# Patient Record
Sex: Female | Born: 1985 | Race: White | Hispanic: No | Marital: Married | Smoking: Never smoker
Health system: Southern US, Community
[De-identification: ages and names within clinical notes are randomized; demographics above are authoritative.]

## PROBLEM LIST (undated history)

## (undated) DIAGNOSIS — Z872 Personal history of diseases of the skin and subcutaneous tissue: Secondary | ICD-10-CM

## (undated) DIAGNOSIS — D649 Anemia, unspecified: Secondary | ICD-10-CM

## (undated) DIAGNOSIS — J189 Pneumonia, unspecified organism: Secondary | ICD-10-CM

## (undated) DIAGNOSIS — F419 Anxiety disorder, unspecified: Secondary | ICD-10-CM

## (undated) DIAGNOSIS — N390 Urinary tract infection, site not specified: Secondary | ICD-10-CM

## (undated) DIAGNOSIS — T8859XA Other complications of anesthesia, initial encounter: Secondary | ICD-10-CM

## (undated) DIAGNOSIS — K219 Gastro-esophageal reflux disease without esophagitis: Secondary | ICD-10-CM

## (undated) DIAGNOSIS — Z9889 Other specified postprocedural states: Secondary | ICD-10-CM

## (undated) DIAGNOSIS — R51 Headache: Secondary | ICD-10-CM

## (undated) DIAGNOSIS — J45909 Unspecified asthma, uncomplicated: Secondary | ICD-10-CM

## (undated) DIAGNOSIS — R112 Nausea with vomiting, unspecified: Secondary | ICD-10-CM

## (undated) DIAGNOSIS — R519 Headache, unspecified: Secondary | ICD-10-CM

## (undated) DIAGNOSIS — E669 Obesity, unspecified: Secondary | ICD-10-CM

## (undated) DIAGNOSIS — T4145XA Adverse effect of unspecified anesthetic, initial encounter: Secondary | ICD-10-CM

## (undated) DIAGNOSIS — K8020 Calculus of gallbladder without cholecystitis without obstruction: Secondary | ICD-10-CM

## (undated) DIAGNOSIS — R87629 Unspecified abnormal cytological findings in specimens from vagina: Secondary | ICD-10-CM

## (undated) DIAGNOSIS — K224 Dyskinesia of esophagus: Secondary | ICD-10-CM

## (undated) HISTORY — DX: Pneumonia, unspecified organism: J18.9

## (undated) HISTORY — DX: Unspecified abnormal cytological findings in specimens from vagina: R87.629

## (undated) HISTORY — DX: Urinary tract infection, site not specified: N39.0

## (undated) HISTORY — PX: TONSILLECTOMY: SUR1361

## (undated) HISTORY — DX: Personal history of diseases of the skin and subcutaneous tissue: Z87.2

## (undated) HISTORY — DX: Dyskinesia of esophagus: K22.4

## (undated) HISTORY — DX: Anemia, unspecified: D64.9

---

## 2009-05-19 ENCOUNTER — Inpatient Hospital Stay (HOSPITAL_COMMUNITY): Admission: RE | Admit: 2009-05-19 | Discharge: 2009-05-22 | Payer: Self-pay | Admitting: Obstetrics and Gynecology

## 2010-09-05 LAB — CBC
HCT: 30.6 % — ABNORMAL LOW (ref 36.0–46.0)
Hemoglobin: 10.4 g/dL — ABNORMAL LOW (ref 12.0–15.0)
MCHC: 34.2 g/dL (ref 30.0–36.0)
RDW: 14 % (ref 11.5–15.5)

## 2010-09-06 LAB — RPR: RPR Ser Ql: NONREACTIVE

## 2010-09-06 LAB — CBC
MCHC: 34.4 g/dL (ref 30.0–36.0)
RDW: 13.8 % (ref 11.5–15.5)

## 2013-06-04 LAB — OB RESULTS CONSOLE RPR: RPR: NONREACTIVE

## 2013-06-04 LAB — OB RESULTS CONSOLE HIV ANTIBODY (ROUTINE TESTING): HIV: NONREACTIVE

## 2013-06-04 LAB — OB RESULTS CONSOLE HEPATITIS B SURFACE ANTIGEN: HEP B S AG: NEGATIVE

## 2013-06-04 LAB — OB RESULTS CONSOLE ANTIBODY SCREEN: ANTIBODY SCREEN: NEGATIVE

## 2013-06-04 LAB — OB RESULTS CONSOLE ABO/RH: RH TYPE: POSITIVE

## 2013-06-04 LAB — OB RESULTS CONSOLE RUBELLA ANTIBODY, IGM: RUBELLA: IMMUNE

## 2013-08-22 ENCOUNTER — Other Ambulatory Visit (HOSPITAL_COMMUNITY): Payer: Self-pay | Admitting: Obstetrics and Gynecology

## 2013-08-22 DIAGNOSIS — R1011 Right upper quadrant pain: Secondary | ICD-10-CM

## 2013-08-25 ENCOUNTER — Ambulatory Visit (HOSPITAL_COMMUNITY)
Admission: RE | Admit: 2013-08-25 | Discharge: 2013-08-25 | Disposition: A | Discharge status: Home / Self Care | Payer: 59 | Source: Ambulatory Visit | Attending: Obstetrics and Gynecology | Admitting: Obstetrics and Gynecology

## 2013-08-25 DIAGNOSIS — R1011 Right upper quadrant pain: Secondary | ICD-10-CM

## 2013-08-26 ENCOUNTER — Ambulatory Visit (HOSPITAL_COMMUNITY): Payer: 59

## 2013-10-27 ENCOUNTER — Other Ambulatory Visit: Payer: Self-pay | Admitting: Obstetrics and Gynecology

## 2013-12-11 ENCOUNTER — Encounter (HOSPITAL_COMMUNITY): Payer: Self-pay | Admitting: Pharmacist

## 2013-12-15 ENCOUNTER — Encounter (HOSPITAL_COMMUNITY): Payer: Self-pay

## 2013-12-16 ENCOUNTER — Encounter (HOSPITAL_COMMUNITY)
Admission: RE | Admit: 2013-12-16 | Discharge: 2013-12-16 | Disposition: A | Discharge status: Home / Self Care | Payer: 59 | Source: Ambulatory Visit | Attending: Obstetrics and Gynecology | Admitting: Obstetrics and Gynecology

## 2013-12-16 ENCOUNTER — Encounter (HOSPITAL_COMMUNITY): Payer: Self-pay

## 2013-12-16 HISTORY — DX: Unspecified asthma, uncomplicated: J45.909

## 2013-12-16 HISTORY — DX: Adverse effect of unspecified anesthetic, initial encounter: T41.45XA

## 2013-12-16 HISTORY — DX: Nausea with vomiting, unspecified: R11.2

## 2013-12-16 HISTORY — DX: Other specified postprocedural states: Z98.890

## 2013-12-16 HISTORY — DX: Other complications of anesthesia, initial encounter: T88.59XA

## 2013-12-16 LAB — URINALYSIS, ROUTINE W REFLEX MICROSCOPIC
Bilirubin Urine: NEGATIVE
Glucose, UA: NEGATIVE mg/dL
Hgb urine dipstick: NEGATIVE
KETONES UR: NEGATIVE mg/dL
NITRITE: NEGATIVE
PH: 7 (ref 5.0–8.0)
PROTEIN: NEGATIVE mg/dL
Specific Gravity, Urine: 1.015 (ref 1.005–1.030)
Urobilinogen, UA: 0.2 mg/dL (ref 0.0–1.0)

## 2013-12-16 LAB — ABO/RH: ABO/RH(D): O POS

## 2013-12-16 LAB — COMPREHENSIVE METABOLIC PANEL
ALBUMIN: 2.6 g/dL — AB (ref 3.5–5.2)
ALT: 23 U/L (ref 0–35)
ANION GAP: 13 (ref 5–15)
AST: 29 U/L (ref 0–37)
Alkaline Phosphatase: 188 U/L — ABNORMAL HIGH (ref 39–117)
BILIRUBIN TOTAL: 0.3 mg/dL (ref 0.3–1.2)
BUN: 4 mg/dL — AB (ref 6–23)
CALCIUM: 9.1 mg/dL (ref 8.4–10.5)
CHLORIDE: 101 meq/L (ref 96–112)
CO2: 23 mEq/L (ref 19–32)
CREATININE: 0.53 mg/dL (ref 0.50–1.10)
GFR calc Af Amer: >90 mL/min (ref >90)
GFR calc non Af Amer: >90 mL/min (ref >90)
Glucose, Bld: 130 mg/dL — ABNORMAL HIGH (ref 70–99)
Potassium: 3.4 mEq/L — ABNORMAL LOW (ref 3.7–5.3)
Sodium: 137 mEq/L (ref 137–147)
Total Protein: 5.6 g/dL — ABNORMAL LOW (ref 6.0–8.3)

## 2013-12-16 LAB — CBC
HCT: 34.7 % — ABNORMAL LOW (ref 36.0–46.0)
Hemoglobin: 11.7 g/dL — ABNORMAL LOW (ref 12.0–15.0)
MCH: 30.2 pg (ref 26.0–34.0)
MCHC: 33.7 g/dL (ref 30.0–36.0)
MCV: 89.7 fL (ref 78.0–100.0)
Platelets: 196 10*3/uL (ref 150–400)
RBC: 3.87 MIL/uL (ref 3.87–5.11)
RDW: 14.3 % (ref 11.5–15.5)
WBC: 9.4 10*3/uL (ref 4.0–10.5)

## 2013-12-16 LAB — URINE MICROSCOPIC-ADD ON

## 2013-12-16 LAB — TYPE AND SCREEN
ABO/RH(D): O POS
Antibody Screen: NEGATIVE

## 2013-12-16 MED ORDER — DEXTROSE 5 % IV SOLN
3.0000 g | INTRAVENOUS | Status: AC
  Administered 2013-12-17: 3 g via INTRAVENOUS
  Filled 2013-12-16: qty 3000

## 2013-12-16 NOTE — Patient Instructions (Signed)
[address] [name]  12/16/2013   Your procedure is scheduled on:  12/17/13  Enter through the Main Entrance of [facility] at [place] up the phone at the desk and dial [phone].   Call this number if you have problems the morning of surgery: [phone]   Remember:   Do not eat food:After Midnight.  Do not drink clear liquids: After Midnight.  Take these medicines the morning of surgery with A SIP OF WATER: Zantac   Do not wear jewelry, make-up or nail polish.  Do not wear lotions, powders, or perfumes. You may wear deodorant.  Do not shave 48 hours prior to surgery.  Do not bring valuables to the hospital.  [facility] is not   responsible for any belongings or valuables brought to the hospital.  Contacts, dentures or bridgework may not be worn into surgery.  Leave suitcase in the car. After surgery it may be brought to your room.  For patients admitted to the hospital, checkout time is 11:00 AM the day of              discharge.   Patients discharged the day of surgery will not be allowed to drive             home.  Name and phone number of your driver: NA  Special Instructions:      Please read over the following fact sheets that you were given:   Surgical Site Infection Prevention

## 2013-12-16 NOTE — H&P (Signed)
NAME:  [name], [name]              ACCOUNT NO.:  [id number]  MEDICAL RECORD NO.:  [id number]  LOCATION:  [place]                           FACILITY:  [facility]  PHYSICIAN:  Lucille Passy. Ulanda Edison, M.D. DATE OF BIRTH:  1985/12/05  DATE OF ADMISSION:  12/16/2013 DATE OF DISCHARGE:                             HISTORY & PHYSICAL   HISTORY OF PRESENT ILLNESS:  This is a 28 year old white female, para 1- 0-0-1, gravida 2, [facility] December 23, 2013, admitted for repeat C-section after declining vaginal birth after cesarean.  Blood group and type O positive, negative antibody, RPR negative, hepatitis B surface antigen negative, HIV negative, GC and Chlamydia negative.  Rubella immune.  Pap smear normal.  Cystic fibrosis declined first trimester and second trimester screening declined.  One hour Glucola 151, 3 hour GTT 82, 179, 147, and 167.  Followup was 83, 205, 135, and 94.  The patient was advised to watch her carbohydrates.  She did not have gestational diabetes.  Repeat HIV and RPR were negative.  Group B strep was negative.  The patient began her prenatal course early in pregnancy. She had an ultrasound on July 21, 2013.  [facility] December 23, 2013.  She complained of right upper quadrant pain at about 22 weeks.  She underwent a gallbladder ultrasound that showed no gallstones.  She continued to have intermittent right upper quadrant pain.  Her initial weight during the pregnancy was 268, her final weight was 290.  Blood pressure remained normal throughout the pregnancy continued to report a normal fetal movement.  She declined vaginal birth after cesarean and is admitted for repeat C-section.  PAST MEDICAL HISTORY:  Reveals no major medical illnesses.  She did have a fracture of the foot in January, 2014.  She has had constipation, heartburn, migraines, and some PVCs.  PAST SURGICAL HISTORY:  She had a T and A at age 57, C-section in 2010.  ALLERGIES:  None known, but she is sensitive to Percocet that  cause vomiting.  SOCIAL HISTORY:  Never smoker.  No alcohol.  Denies illicit drugs.  Four years of college.  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 She works for hospice and administration.  Her husband is a Engineer, structural with [employer].  FAMILY HISTORY:  Father with diabetes, high blood pressure.  Mother with high blood pressure, irritable bowel.  Maternal grandmother has heart failure, COPD, disorder of the thyroid gland, stroke, and dementia. Maternal grandfather, heart attack, stroke, coronary artery bypass graft, malignant melanoma of the skin, heart disease.  Paternal grandmother with Alzheimer's.  Maternal aunt with emphysema, TIAs, COPD, multiple sclerosis, stroke, and cancer of the cervix.  Paternal aunt had tumor of the pancreas that was malignant.  PHYSICAL EXAMINATION:  GENERAL:  On admission, this is an obese white female, in no distress. VITAL SIGNS:  She weighs 290 pounds.  Her blood pressure is 118/78, pulse is 70. HEART:  Normal size and sounds.  No murmurs. LUNGS:  Clear to auscultation. ABDOMEN:  Fundal height is 40 cm.  Fetal heart tones are normal.  The prior incision is somewhat in the crease, but I plan to use the same incision.  Cervix is not examined today  by Leopold's maneuver, it feels like its vertex.  ADMITTING IMPRESSION:  Intrauterine pregnancy 39 weeks 1 day, prior C- section, declines vaginal birth after cesarean.  The patient is admitted for repeat C-section.     Lucille Passy. Ulanda Edison, M.D.     TFH/MEDQ  D:  12/16/2013  T:  12/16/2013  Job:  [id number]

## 2013-12-17 ENCOUNTER — Encounter (HOSPITAL_COMMUNITY): Payer: Self-pay | Admitting: Anesthesiology

## 2013-12-17 ENCOUNTER — Inpatient Hospital Stay (HOSPITAL_COMMUNITY)
Admission: RE | Admit: 2013-12-17 | Discharge: 2013-12-19 | DRG: 765 | Disposition: A | Discharge status: Home / Self Care | Payer: 59 | Source: Ambulatory Visit | Attending: Obstetrics and Gynecology | Admitting: Obstetrics and Gynecology

## 2013-12-17 ENCOUNTER — Inpatient Hospital Stay (HOSPITAL_COMMUNITY): Payer: 59 | Admitting: Anesthesiology

## 2013-12-17 ENCOUNTER — Encounter (HOSPITAL_COMMUNITY): Payer: 59 | Admitting: Anesthesiology

## 2013-12-17 ENCOUNTER — Encounter (HOSPITAL_COMMUNITY)
Admission: RE | Disposition: A | Discharge status: Home / Self Care | Payer: Self-pay | Source: Ambulatory Visit | Attending: Obstetrics and Gynecology

## 2013-12-17 DIAGNOSIS — Z98891 History of uterine scar from previous surgery: Secondary | ICD-10-CM

## 2013-12-17 DIAGNOSIS — E669 Obesity, unspecified: Secondary | ICD-10-CM | POA: Diagnosis present

## 2013-12-17 DIAGNOSIS — O34219 Maternal care for unspecified type scar from previous cesarean delivery: Principal | ICD-10-CM | POA: Diagnosis present

## 2013-12-17 DIAGNOSIS — K219 Gastro-esophageal reflux disease without esophagitis: Secondary | ICD-10-CM | POA: Diagnosis present

## 2013-12-17 DIAGNOSIS — Z6841 Body Mass Index (BMI) 40.0 and over, adult: Secondary | ICD-10-CM

## 2013-12-17 DIAGNOSIS — O99214 Obesity complicating childbirth: Secondary | ICD-10-CM

## 2013-12-17 SURGERY — Surgical Case
Anesthesia: Spinal | Site: Abdomen

## 2013-12-17 MED ORDER — PHENYLEPHRINE HCL 10 MG/ML IJ SOLN
INTRAMUSCULAR | Status: AC
  Filled 2013-12-17: qty 1

## 2013-12-17 MED ORDER — LACTATED RINGERS IV SOLN
INTRAVENOUS | Status: DC | PRN
  Administered 2013-12-17: 12:00:00 via INTRAVENOUS

## 2013-12-17 MED ORDER — SIMETHICONE 80 MG PO CHEW
80.0000 mg | CHEWABLE_TABLET | ORAL | Status: DC
  Administered 2013-12-18 – 2013-12-19 (×2): 80 mg via ORAL
  Filled 2013-12-17 (×2): qty 1

## 2013-12-17 MED ORDER — OXYTOCIN 40 UNITS IN LACTATED RINGERS INFUSION - SIMPLE MED: 62.5000 mL/h | INTRAVENOUS | Status: AC

## 2013-12-17 MED ORDER — MENTHOL 3 MG MT LOZG: 1.0000 | LOZENGE | OROMUCOSAL | Status: DC | PRN

## 2013-12-17 MED ORDER — ONDANSETRON HCL 4 MG/2ML IJ SOLN
INTRAMUSCULAR | Status: AC
  Filled 2013-12-17: qty 2

## 2013-12-17 MED ORDER — PHENYLEPHRINE 8 MG IN D5W 100 ML (0.08MG/ML) PREMIX OPTIME
INJECTION | INTRAVENOUS | Status: DC | PRN
  Administered 2013-12-17: 60 ug/min via INTRAVENOUS

## 2013-12-17 MED ORDER — DIBUCAINE 1 % RE OINT: 1.0000 "application " | TOPICAL_OINTMENT | RECTAL | Status: DC | PRN

## 2013-12-17 MED ORDER — SCOPOLAMINE 1 MG/3DAYS TD PT72
1.0000 | MEDICATED_PATCH | Freq: Once | TRANSDERMAL | Status: DC
  Administered 2013-12-17: 1.5 mg via TRANSDERMAL

## 2013-12-17 MED ORDER — BUPIVACAINE IN DEXTROSE 0.75-8.25 % IT SOLN
INTRATHECAL | Status: DC | PRN
  Administered 2013-12-17: 2 mL via INTRATHECAL

## 2013-12-17 MED ORDER — CALCIUM CARBONATE ANTACID 500 MG PO CHEW
1.0000 | CHEWABLE_TABLET | Freq: Two times a day (BID) | ORAL | Status: DC
  Administered 2013-12-18: 200 mg via ORAL
  Filled 2013-12-17 (×2): qty 1

## 2013-12-17 MED ORDER — NALOXONE HCL 0.4 MG/ML IJ SOLN: 0.2500 mg | Freq: Once | INTRAMUSCULAR | Status: DC

## 2013-12-17 MED ORDER — ONDANSETRON HCL 4 MG/2ML IJ SOLN: 4.0000 mg | Freq: Three times a day (TID) | INTRAMUSCULAR | Status: DC | PRN

## 2013-12-17 MED ORDER — ONDANSETRON HCL 4 MG PO TABS: 4.0000 mg | ORAL_TABLET | ORAL | Status: DC | PRN

## 2013-12-17 MED ORDER — NALOXONE HCL 1 MG/ML IJ SOLN
1.0000 ug/kg/h | INTRAMUSCULAR | Status: DC | PRN
  Filled 2013-12-17: qty 2

## 2013-12-17 MED ORDER — SODIUM CHLORIDE 0.9 % IJ SOLN: 3.0000 mL | INTRAMUSCULAR | Status: DC | PRN

## 2013-12-17 MED ORDER — PNEUMOCOCCAL VAC POLYVALENT 25 MCG/0.5ML IJ INJ
0.5000 mL | INJECTION | INTRAMUSCULAR | Status: DC
  Filled 2013-12-17: qty 0.5

## 2013-12-17 MED ORDER — ONDANSETRON HCL 4 MG/2ML IJ SOLN: 4.0000 mg | INTRAMUSCULAR | Status: DC | PRN

## 2013-12-17 MED ORDER — SIMETHICONE 80 MG PO CHEW
80.0000 mg | CHEWABLE_TABLET | Freq: Three times a day (TID) | ORAL | Status: DC
  Administered 2013-12-17 – 2013-12-19 (×5): 80 mg via ORAL
  Filled 2013-12-17 (×5): qty 1

## 2013-12-17 MED ORDER — FENTANYL CITRATE 0.05 MG/ML IJ SOLN
25.0000 ug | INTRAMUSCULAR | Status: DC | PRN
  Administered 2013-12-17 (×2): 25 ug via INTRAVENOUS

## 2013-12-17 MED ORDER — LANOLIN HYDROUS EX OINT: 1.0000 "application " | TOPICAL_OINTMENT | CUTANEOUS | Status: DC | PRN

## 2013-12-17 MED ORDER — NALOXONE HCL 0.4 MG/ML IJ SOLN: 0.4000 mg | INTRAMUSCULAR | Status: DC | PRN

## 2013-12-17 MED ORDER — MORPHINE SULFATE 0.5 MG/ML IJ SOLN
INTRAMUSCULAR | Status: AC
  Filled 2013-12-17: qty 10

## 2013-12-17 MED ORDER — MORPHINE SULFATE (PF) 0.5 MG/ML IJ SOLN
INTRAMUSCULAR | Status: DC | PRN
  Administered 2013-12-17: .15 mg via INTRATHECAL

## 2013-12-17 MED ORDER — LACTATED RINGERS IV SOLN
Freq: Once | INTRAVENOUS | Status: AC
  Administered 2013-12-17: 09:00:00 via INTRAVENOUS

## 2013-12-17 MED ORDER — METOCLOPRAMIDE HCL 5 MG/ML IJ SOLN: 10.0000 mg | Freq: Three times a day (TID) | INTRAMUSCULAR | Status: DC | PRN

## 2013-12-17 MED ORDER — DIPHENHYDRAMINE HCL 25 MG PO CAPS
25.0000 mg | ORAL_CAPSULE | ORAL | Status: DC | PRN
  Administered 2013-12-17 – 2013-12-18 (×2): 25 mg via ORAL
  Filled 2013-12-17: qty 1

## 2013-12-17 MED ORDER — TETANUS-DIPHTH-ACELL PERTUSSIS 5-2.5-18.5 LF-MCG/0.5 IM SUSP: 0.5000 mL | Freq: Once | INTRAMUSCULAR | Status: DC

## 2013-12-17 MED ORDER — OXYTOCIN 10 UNIT/ML IJ SOLN
40.0000 [IU] | INTRAVENOUS | Status: DC | PRN
  Administered 2013-12-17: 40 [IU] via INTRAVENOUS

## 2013-12-17 MED ORDER — OXYTOCIN 10 UNIT/ML IJ SOLN
INTRAMUSCULAR | Status: AC
  Filled 2013-12-17: qty 4

## 2013-12-17 MED ORDER — PHENYLEPHRINE 40 MCG/ML (10ML) SYRINGE FOR IV PUSH (FOR BLOOD PRESSURE SUPPORT)
PREFILLED_SYRINGE | INTRAVENOUS | Status: AC
  Filled 2013-12-17: qty 5

## 2013-12-17 MED ORDER — MEPERIDINE HCL 25 MG/ML IJ SOLN: 6.2500 mg | INTRAMUSCULAR | Status: DC | PRN

## 2013-12-17 MED ORDER — LACTATED RINGERS IV SOLN
INTRAVENOUS | Status: AC
  Administered 2013-12-17 – 2013-12-18 (×2): via INTRAVENOUS

## 2013-12-17 MED ORDER — NALBUPHINE HCL 10 MG/ML IJ SOLN
2.5000 mg | Freq: Once | INTRAMUSCULAR | Status: AC
  Administered 2013-12-17: 2.5 mg via SUBCUTANEOUS
  Filled 2013-12-17: qty 1

## 2013-12-17 MED ORDER — DIPHENHYDRAMINE HCL 25 MG PO CAPS
25.0000 mg | ORAL_CAPSULE | Freq: Four times a day (QID) | ORAL | Status: DC | PRN
  Filled 2013-12-17 (×2): qty 1

## 2013-12-17 MED ORDER — ONDANSETRON HCL 4 MG/2ML IJ SOLN
INTRAMUSCULAR | Status: DC | PRN
  Administered 2013-12-17: 4 mg via INTRAVENOUS

## 2013-12-17 MED ORDER — SENNOSIDES-DOCUSATE SODIUM 8.6-50 MG PO TABS
2.0000 | ORAL_TABLET | ORAL | Status: DC
  Administered 2013-12-18 – 2013-12-19 (×2): 2 via ORAL
  Filled 2013-12-17 (×2): qty 2

## 2013-12-17 MED ORDER — SCOPOLAMINE 1 MG/3DAYS TD PT72: 1.0000 | MEDICATED_PATCH | Freq: Once | TRANSDERMAL | Status: DC

## 2013-12-17 MED ORDER — MORPHINE SULFATE 0.5 MG/ML IJ SOLN
INTRAMUSCULAR | Status: AC
Start: 2013-12-17 — End: 2013-12-17
  Filled 2013-12-17: qty 10

## 2013-12-17 MED ORDER — SCOPOLAMINE 1 MG/3DAYS TD PT72
MEDICATED_PATCH | TRANSDERMAL | Status: DC
Start: 2013-12-17 — End: 2013-12-19
  Administered 2013-12-17: 1.5 mg via TRANSDERMAL
  Filled 2013-12-17: qty 1

## 2013-12-17 MED ORDER — FAMOTIDINE 20 MG PO TABS
20.0000 mg | ORAL_TABLET | Freq: Two times a day (BID) | ORAL | Status: AC
  Administered 2013-12-18: 20 mg via ORAL
  Filled 2013-12-17 (×2): qty 1

## 2013-12-17 MED ORDER — CEFAZOLIN SODIUM-DEXTROSE 2-3 GM-% IV SOLR
2.0000 g | Freq: Three times a day (TID) | INTRAVENOUS | Status: AC
  Administered 2013-12-17 – 2013-12-18 (×2): 2 g via INTRAVENOUS
  Filled 2013-12-17 (×2): qty 50

## 2013-12-17 MED ORDER — FENTANYL CITRATE 0.05 MG/ML IJ SOLN
INTRAMUSCULAR | Status: AC
  Filled 2013-12-17: qty 2

## 2013-12-17 MED ORDER — ZOLPIDEM TARTRATE 5 MG PO TABS: 5.0000 mg | ORAL_TABLET | Freq: Every evening | ORAL | Status: DC | PRN

## 2013-12-17 MED ORDER — DIPHENHYDRAMINE HCL 50 MG/ML IJ SOLN
12.5000 mg | INTRAMUSCULAR | Status: DC | PRN
  Administered 2013-12-17: 12.5 mg via INTRAVENOUS

## 2013-12-17 MED ORDER — HYDROCODONE-ACETAMINOPHEN 5-325 MG PO TABS
1.0000 | ORAL_TABLET | ORAL | Status: DC | PRN
  Administered 2013-12-18 (×5): 1 via ORAL
  Filled 2013-12-17 (×4): qty 1
  Filled 2013-12-17: qty 2

## 2013-12-17 MED ORDER — PRENATAL MULTIVITAMIN CH
1.0000 | ORAL_TABLET | Freq: Every day | ORAL | Status: DC
  Administered 2013-12-18 – 2013-12-19 (×2): 1 via ORAL
  Filled 2013-12-17 (×3): qty 1

## 2013-12-17 MED ORDER — PHENYLEPHRINE 8 MG IN D5W 100 ML (0.08MG/ML) PREMIX OPTIME
INJECTION | INTRAVENOUS | Status: AC
  Filled 2013-12-17: qty 100

## 2013-12-17 MED ORDER — MEASLES, MUMPS & RUBELLA VAC ~~LOC~~ INJ
0.5000 mL | INJECTION | Freq: Once | SUBCUTANEOUS | Status: DC
  Filled 2013-12-17: qty 0.5

## 2013-12-17 MED ORDER — DIPHENHYDRAMINE HCL 50 MG/ML IJ SOLN
INTRAMUSCULAR | Status: AC
  Filled 2013-12-17: qty 1

## 2013-12-17 MED ORDER — KETOROLAC TROMETHAMINE 30 MG/ML IJ SOLN
30.0000 mg | Freq: Four times a day (QID) | INTRAMUSCULAR | Status: AC | PRN
  Administered 2013-12-17: 30 mg via INTRAVENOUS

## 2013-12-17 MED ORDER — SIMETHICONE 80 MG PO CHEW: 80.0000 mg | CHEWABLE_TABLET | ORAL | Status: DC | PRN

## 2013-12-17 MED ORDER — KETOROLAC TROMETHAMINE 30 MG/ML IJ SOLN: 30.0000 mg | Freq: Four times a day (QID) | INTRAMUSCULAR | Status: AC | PRN

## 2013-12-17 MED ORDER — KETOROLAC TROMETHAMINE 30 MG/ML IJ SOLN
INTRAMUSCULAR | Status: AC
Start: 2013-12-17 — End: 2013-12-18
  Filled 2013-12-17: qty 1

## 2013-12-17 MED ORDER — IBUPROFEN 600 MG PO TABS
600.0000 mg | ORAL_TABLET | Freq: Four times a day (QID) | ORAL | Status: DC
  Administered 2013-12-17 – 2013-12-19 (×7): 600 mg via ORAL
  Filled 2013-12-17 (×7): qty 1

## 2013-12-17 MED ORDER — FENTANYL CITRATE 0.05 MG/ML IJ SOLN
INTRAMUSCULAR | Status: DC | PRN
  Administered 2013-12-17: 25 ug via INTRATHECAL

## 2013-12-17 MED ORDER — DIPHENHYDRAMINE HCL 50 MG/ML IJ SOLN: 25.0000 mg | INTRAMUSCULAR | Status: DC | PRN

## 2013-12-17 MED ORDER — WITCH HAZEL-GLYCERIN EX PADS: 1.0000 "application " | MEDICATED_PAD | CUTANEOUS | Status: DC | PRN

## 2013-12-17 MED ORDER — OXYCODONE-ACETAMINOPHEN 5-325 MG PO TABS: 1.0000 | ORAL_TABLET | ORAL | Status: DC | PRN

## 2013-12-17 MED ORDER — FENTANYL CITRATE 0.05 MG/ML IJ SOLN
INTRAMUSCULAR | Status: AC
Start: 2013-12-17 — End: 2013-12-17
  Filled 2013-12-17: qty 2

## 2013-12-17 MED ORDER — LACTATED RINGERS IV SOLN
INTRAVENOUS | Status: DC
  Administered 2013-12-17 (×3): via INTRAVENOUS

## 2013-12-17 SURGICAL SUPPLY — 31 items
CLAMP CORD UMBIL (MISCELLANEOUS) IMPLANT
CLOTH BEACON ORANGE TIMEOUT ST (SAFETY) ×3 IMPLANT
CONTAINER PREFILL 10% NBF 15ML (MISCELLANEOUS) IMPLANT
DRAPE LG THREE QUARTER DISP (DRAPES) ×3 IMPLANT
DRSG OPSITE POSTOP 4X10 (GAUZE/BANDAGES/DRESSINGS) ×3 IMPLANT
DRSG VASELINE 3X18 (GAUZE/BANDAGES/DRESSINGS) ×3 IMPLANT
DURAPREP 26ML APPLICATOR (WOUND CARE) ×3 IMPLANT
ELECT REM PT RETURN 9FT ADLT (ELECTROSURGICAL) ×3
ELECTRODE REM PT RTRN 9FT ADLT (ELECTROSURGICAL) ×1 IMPLANT
EXTRACTOR VACUUM KIWI (MISCELLANEOUS) IMPLANT
EXTRACTOR VACUUM M CUP 4 TUBE (SUCTIONS) IMPLANT
EXTRACTOR VACUUM M CUP 4' TUBE (SUCTIONS)
GLOVE BIO SURGEON STRL SZ7.5 (GLOVE) ×3 IMPLANT
GOWN STRL REUS W/TWL LRG LVL3 (GOWN DISPOSABLE) ×9 IMPLANT
KIT ABG SYR 3ML LUER SLIP (SYRINGE) IMPLANT
NEEDLE HYPO 25X5/8 SAFETYGLIDE (NEEDLE) IMPLANT
NS IRRIG 1000ML POUR BTL (IV SOLUTION) ×3 IMPLANT
PACK C SECTION WH (CUSTOM PROCEDURE TRAY) ×3 IMPLANT
PAD OB MATERNITY 4.3X12.25 (PERSONAL CARE ITEMS) ×3 IMPLANT
RTRCTR C-SECT PINK 25CM LRG (MISCELLANEOUS) ×3 IMPLANT
STAPLER VISISTAT 35W (STAPLE) ×3 IMPLANT
SUT PLAIN 0 NONE (SUTURE) IMPLANT
SUT VIC AB 0 CT1 36 (SUTURE) ×21 IMPLANT
SUT VIC AB 3-0 CTX 36 (SUTURE) ×3 IMPLANT
SUT VIC AB 3-0 SH 27 (SUTURE)
SUT VIC AB 3-0 SH 27X BRD (SUTURE) IMPLANT
SUT VIC AB 4-0 KS 27 (SUTURE) IMPLANT
SUT VICRYL 0 TIES 12 18 (SUTURE) IMPLANT
TOWEL OR 17X24 6PK STRL BLUE (TOWEL DISPOSABLE) ×3 IMPLANT
TRAY FOLEY CATH 14FR (SET/KITS/TRAYS/PACK) ×3 IMPLANT
WATER STERILE IRR 1000ML POUR (IV SOLUTION) IMPLANT

## 2013-12-17 NOTE — Anesthesia Procedure Notes (Signed)
Spinal  Patient location during procedure: OR Start time: 12/17/2013 11:41 AM Staffing Anesthesiologist: Ivi Griffith A. Performed by: anesthesiologist  Preanesthetic Checklist Completed: patient identified, site marked, surgical consent, pre-op evaluation, timeout performed, IV checked, risks and benefits discussed and monitors and equipment checked Spinal Block Patient position: sitting Prep: site prepped and draped and DuraPrep Patient monitoring: heart rate, cardiac monitor, continuous pulse ox and blood pressure Approach: midline Location: L3-4 Injection technique: single-shot Needle Needle type: Sprotte  Needle gauge: 24 G Needle length: 9 cm Needle insertion depth: 6 cm Assessment Sensory level: T4 Additional Notes Patient tolerated procedure well. Adequate sensory level.

## 2013-12-17 NOTE — Op Note (Signed)
NAME:  [name], [name]              ACCOUNT NO.:  [id number]  MEDICAL RECORD NO.:  [id number]  LOCATION:  [facility]                          FACILITY:  [facility]  PHYSICIAN:  Lucille Passy. [name], M.D. DATE OF BIRTH:  1985-07-08  DATE OF PROCEDURE: DATE OF DISCHARGE:                              OPERATIVE REPORT   PREOPERATIVE DIAGNOSIS:  Intrauterine pregnancy at 39+ weeks, prior cesarean section, declined vaginal birth after cesarean.  POSTOPERATIVE DIAGNOSIS:  Intrauterine pregnancy at 39+ weeks, prior cesarean section, declined vaginal birth after cesarean.  OPERATION:  Low-transverse cervical C-section.  OPERATOR:  Lucille Passy. [name], M.D.  ASSISTANT:  Thornell Sartorius, MD.  ANESTHESIA:  Spinal anesthesia.  DESCRIPTION OF PROCEDURE:  The patient was brought to the operating room and given a spinal anesthetic by Dr. Royce Macadamia.  She was then placed on the table supine, tilted to the left.  The urethra and abdomen were prepped with Betadine and DuraPrep respectively.  The DuraPrep was allowed to dry for 3 minutes after the Foley catheter was inserted.  A time-out was done.  The abdomen was draped as a sterile field.  Anesthesia was confirmed by pinching the lower abdomen with an Allis clamp.  The old incision was utilized and carried in layers through the skin, subcutaneous tissue, and fascia.  The fascia was incised transversely and separated from the rectus muscle superiorly.  The peritoneum was entered.  Peritoneal incision was enlarged.  The incision was then stretched so that an Alexis retractor could be placed and expose the lower uterine segment.  A short transverse incision was made through the superficial layers of the myometrium.  I went the rest of the way with my finger, delivered a large amount of clear amniotic fluid.  Stretched the incision superiorly and inferiorly.  Easily delivered the infant's vertex through the incisional opening.  There were 2 loops of nuchal cord.  The infant  was given Apgars of 9 and 9 at 1 and 5 minutes.  After the cord was clamped, the infant was given to the neonatologist.  The placenta was removed.  The inside of the uterus was inspected, found to be free of any debris.  Uterus, tubes, and ovaries were inspected and found to be normal.  There were no adhesions.  The uterine incision was then closed in 2 layers using a running lock suture of 0 Vicryl on the first layer, nonlocking suture of the same material on the second layer. There were no further stitches required, but a couple of places were bovied to obtain complete hemostasis.  Liberal irrigation confirmed hemostasis.  The Alexis retractor was removed.  It was confirmed that there was no bowel under the wings of the Alexis retractor.  The area was liberally irrigated confirming hemostasis, and the abdominal wall was closed with layers using interrupted sutures of 0 Vicryl to close the peritoneum and rectus muscle in 1 layer, 2 running sutures of 0 Vicryl on the fascia, running 3-0 Vicryl on the subcutaneous tissue, and staples on the skin.  The patient seemed to tolerate the procedure well.  Blood loss was estimated at not more than 800 mL.  Sponge and needle counts were correct, and the patient  was returned to recovery in satisfactory condition.     Lucille Passy. [name], M.D.     TFH/MEDQ  D:  12/17/2013  T:  12/17/2013  Job:  [id number]

## 2013-12-17 NOTE — Progress Notes (Signed)
Patient ID: [name], female   DOB: 1986-04-28, 28 y.o.   MRN: [id number] Op note:  Repeat c section at 39 weeks for living female Apgars 9 and 9 at 1 and 5 minutes. Spinal anesthesia Normal uterus, tubes and ovaries. EBL 800 cc's. Operator: Ulanda Edison, Asst: [facility]est[facility] Dr. Royce Macadamia.

## 2013-12-17 NOTE — Transfer of Care (Signed)
Immediate Anesthesia Transfer of Care Note  Patient: [name]  Procedure(s) Performed: Procedure(s) with comments: CESAREAN SECTION (N/A) - 1 1/2hrs OR time  Patient Location: PACU  Anesthesia Type:Spinal  Level of Consciousness: awake, alert  and oriented  Airway & Oxygen Therapy: Patient Spontanous Breathing  Post-op Assessment: Report given to PACU RN and Post -op Vital signs reviewed and stable  Post vital signs: Reviewed and stable  Complications: No apparent anesthesia complications

## 2013-12-17 NOTE — Anesthesia Preprocedure Evaluation (Signed)
Anesthesia Evaluation  Patient identified by MRN, date of birth, ID band Patient awake    Reviewed: Allergy & Precautions, H&P , NPO status , Patient's Chart, lab work & pertinent test results  History of Anesthesia Complications (+) PONV and history of anesthetic complications  Airway Mallampati: III TM Distance: >3 FB Neck ROM: Full    Dental no notable dental hx. (+) Teeth Intact   Pulmonary asthma ,  breath sounds clear to auscultation  Pulmonary exam normal       Cardiovascular negative cardio ROS  Rhythm:Regular Rate:Normal     Neuro/Psych negative neurological ROS  negative psych ROS   GI/Hepatic Neg liver ROS, GERD-  Medicated and Controlled,  Endo/Other  Morbid obesity  Renal/GU negative Renal ROS  negative genitourinary   Musculoskeletal negative musculoskeletal ROS (+)   Abdominal (+) + obese,   Peds  Hematology  (+) anemia ,   Anesthesia Other Findings   Reproductive/Obstetrics (+) Pregnancy Previous C/section                           Anesthesia Physical Anesthesia Plan  ASA: III  Anesthesia Plan: Spinal   Post-op Pain Management:    Induction:   Airway Management Planned: Natural Airway  Additional Equipment:   Intra-op Plan:   Post-operative Plan:   Informed Consent: I have reviewed the patients History and Physical, chart, labs and discussed the procedure including the risks, benefits and alternatives for the proposed anesthesia with the patient or authorized representative who has indicated his/her understanding and acceptance.     Plan Discussed with: Anesthesiologist, CRNA and Surgeon  Anesthesia Plan Comments:         Anesthesia Quick Evaluation

## 2013-12-17 NOTE — Progress Notes (Signed)
Patient ID: [name], female   DOB: 11-18-1985, 28 y.o.   MRN: [id number] I examined this lady 12-16-13 and she reports no change in her health since that time except nausea that she attributes to heartburn which has improved since she took zantac this AM

## 2013-12-18 ENCOUNTER — Encounter (HOSPITAL_COMMUNITY): Payer: Self-pay | Admitting: Obstetrics and Gynecology

## 2013-12-18 LAB — CBC
HCT: 31.5 % — ABNORMAL LOW (ref 36.0–46.0)
HEMOGLOBIN: 10.7 g/dL — AB (ref 12.0–15.0)
MCH: 30.8 pg (ref 26.0–34.0)
MCHC: 34 g/dL (ref 30.0–36.0)
MCV: 90.8 fL (ref 78.0–100.0)
Platelets: 161 10*3/uL (ref 150–400)
RBC: 3.47 MIL/uL — AB (ref 3.87–5.11)
RDW: 14.6 % (ref 11.5–15.5)
WBC: 12 10*3/uL — AB (ref 4.0–10.5)

## 2013-12-18 MED ORDER — HYDROCODONE-ACETAMINOPHEN 5-325 MG PO TABS
1.0000 | ORAL_TABLET | ORAL | Status: DC | PRN
  Administered 2013-12-18: 2 via ORAL
  Administered 2013-12-19 (×3): 1 via ORAL
  Filled 2013-12-18: qty 2
  Filled 2013-12-18 (×3): qty 1

## 2013-12-18 NOTE — Progress Notes (Signed)
Patient ID: [name], female   DOB: Apr 02, 1986, 28 y.o.   MRN: [id number] #1 afebrile BP normal HGB stable Has tolerated oral feeding

## 2013-12-18 NOTE — Anesthesia Postprocedure Evaluation (Signed)
Anesthesia Post Note  Patient: [name]  Procedure(s) Performed: Procedure(s) (LRB): CESAREAN SECTION (N/A)  Anesthesia type: SAB  Patient location: Mother/Baby  Post pain: Pain level controlled  Post assessment: Post-op Vital signs reviewed  Last Vitals:  Filed Vitals:   12/18/13 0515  BP: 112/76  Pulse: 84  Temp: 36.6 C  Resp: 18    Post vital signs: Reviewed  Level of consciousness: awake  Complications: No apparent anesthesia complications

## 2013-12-18 NOTE — Lactation Note (Signed)
This note was copied from the chart of [name]. Lactation Consultation Note  Patient Name: [name] [id number] Date: 12/18/2013 Reason for consult: Follow-up assessment Follow up visit - Baby already latched, and mom was independent.  Per mom having some tenderness when the baby latches and then improves. LC noted a consistent pattern with swallows, increased with breast compressions.  LC recommended prior to latch, breast massage, hand express, latch using cross cradle  with breast compressions  Until the baby the baby is in a consistent pattern and then go to cradle position, and then intermittent breast compressions. Discussed sore nipple and engorgement prevention and tx if needed.    Maternal Data Formula Feeding for Exclusion: No Does the patient have breastfeeding experience prior to this delivery?: Yes  Feeding Feeding Type:  (baby presently latched with depth, and noted swallows ) Length of feed:  (per mom , baby still feeding at 1515, with swallows )  LATCH Score/Interventions Latch:  (latched with depth ) Intervention(s): Skin to skin  Audible Swallowing:  (multiply swallows noted )  Type of Nipple: Everted at rest and after stimulation  Comfort (Breast/Nipple):  (per mom inintially discomfort and improves )     Hold (Positioning):  (mom independent with latch ) Intervention(s): Breastfeeding basics reviewed  LATCH Score: 9  Lactation Tools Discussed/Used     Consult Status Consult Status: Follow-up Date: 12/19/13 Follow-up type: In-patient    Myer Haff 12/18/2013, 3:38 PM

## 2013-12-18 NOTE — Lactation Note (Signed)
This note was copied from the chart of [name]. Lactation Consultation Note Mom BF her first child for 6 weeks until she went back to work but pumped and gave her breast milk in a bottle. Her only challenges was that her 1st child had reflux so bad they had to give her medication and thicken the milk. She didn't get any formula. She plans to BF for 6 weeks until she goes back to work then she will breast and bottle feed after she returns to work. Mom encouraged to feed baby 8-12 times/24 hours and with feeding cues. Mom reports + breast changes w/pregnancy. Mom encouraged to feed baby w/feeding cues. Encouraged to call for assistance if needed and to verify proper latch. Reviewed Baby & Me book's Breastfeeding Basics. Mom has generalized edema and nipples/breast appear to have edema as well. Hand pump given and colostrum noted. States baby is latching well. Moms breast are large pendulum shape and heavy. Encouraged to elevate w/wash cloth. Hand expression taught to Mom. [facility] brochure given w/resources, support groups and [facility] services.Referred to Baby and Me Book in Breastfeeding section Pg. 22-23 for position options and Proper latch demonstration. Patient Name: [name] Today's Date: 12/18/2013     Maternal Data    Feeding    LATCH Score/Interventions                      Lactation Tools Discussed/Used     Consult Status      Lucero Ide, Elta Guadeloupe 12/18/2013, 5:11 AM

## 2013-12-18 NOTE — Anesthesia Postprocedure Evaluation (Signed)
  Anesthesia Post-op Note  Patient: [name]  Procedure(s) Performed: Procedure(s) with comments: CESAREAN SECTION (N/A) - 1 1/2hrs OR time  Patient Location: PACU  Anesthesia Type:Spinal  Level of Consciousness: awake, alert  and oriented  Airway and Oxygen Therapy: Patient Spontanous Breathing  Post-op Pain: none  Post-op Assessment: Post-op Vital signs reviewed, Patient's Cardiovascular Status Stable, Respiratory Function Stable, Patent Airway, No signs of Nausea or vomiting, Pain level controlled, No headache and No backache  Post-op Vital Signs: Reviewed and stable  Complications: No apparent anesthesia complications

## 2013-12-19 ENCOUNTER — Encounter (HOSPITAL_COMMUNITY): Payer: Self-pay | Admitting: *Deleted

## 2013-12-19 LAB — RPR

## 2013-12-19 MED ORDER — HYDROCODONE-ACETAMINOPHEN 5-325 MG PO TABS: 1.0000 | ORAL_TABLET | Freq: Four times a day (QID) | ORAL | Status: DC | PRN

## 2013-12-19 MED ORDER — IBUPROFEN 600 MG PO TABS: 600.0000 mg | ORAL_TABLET | Freq: Four times a day (QID) | ORAL | Status: DC | PRN

## 2013-12-19 NOTE — Discharge Instructions (Signed)
booklet °

## 2013-12-19 NOTE — Discharge Summary (Signed)
NAME:  [name], [name]              ACCOUNT NO.:  [id number]  MEDICAL RECORD NO.:  [id number]  LOCATION:  9107                          FACILITY:  [facility]  PHYSICIAN:  Lucille Passy. Ulanda Edison, M.D. DATE OF BIRTH:  Sep 26, 1985  DATE OF ADMISSION:  12/17/2013 DATE OF DISCHARGE:  12/19/2013                              DISCHARGE SUMMARY   A 28 year old white female, para 1-0-0-1, gravida 2, [facility] December 23, 2013, admitted for repeat C-section after declining vaginal birth after cesarean.  All labs were normal except she did have 1 elevated glucose on the 3-hour GTT on 2 occasions.  She had some right upper abdominal pain, but ultrasound showed no gallstones.  She had declined screening tests.  She was admitted and underwent a low-transverse cervical C- section by Dr. Ulanda Edison with Dr. Melba Coon assisting under spinal anesthesia. Delivery of a 9 pound 5 ounce female infant with Apgars of 9 and 9 at 1 and 5 minutes.  Postoperatively, the patient did well, had no problems, ambulated well without difficulty, tolerated a regular diet, passed flatus, voided well, and was ready for discharge on the second postoperative day.  Laboratory data showed initial hemoglobin of 11 plus, Followup hemoglobin was 10.7, white count 12,000 platelet count 161,000.  FINAL DIAGNOSES:  Intrauterine pregnancy at 39 plus weeks, prior C- section, declined vaginal birth after cesarean section.  OPERATION:  Low-transverse cervical C-section.  FINAL CONDITION:  Improved.  INSTRUCTIONS:  Include our regular discharge instruction booklet as well as an after visit summary.  Prescriptions for Motrin 600 mg 30 tablets, 1 every 6 hours as needed for pain and Vicodin 5/325, 30 tablets, 1 every 6 hours as needed for pain.  She is to continue her prenatal vitamins and return to the office in 1 week to have her staples removed.     Lucille Passy. Ulanda Edison, M.D.     TFH/MEDQ  D:  12/19/2013  T:  12/19/2013  Job:  [id number]

## 2013-12-19 NOTE — Progress Notes (Signed)
Patient ID: [name], female   DOB: 12/28/1985, 28 y.o.   MRN: [id number] #2 afebrile BP normal Tolerating a regular diet, passing flatus, voiding well and ambulating well. For d/c

## 2014-04-06 ENCOUNTER — Encounter (HOSPITAL_COMMUNITY): Payer: Self-pay | Admitting: *Deleted

## 2015-09-04 ENCOUNTER — Emergency Department (HOSPITAL_COMMUNITY): Payer: 59

## 2015-09-04 ENCOUNTER — Emergency Department (HOSPITAL_COMMUNITY)
Admission: EM | Admit: 2015-09-04 | Discharge: 2015-09-05 | Disposition: A | Discharge status: Home / Self Care | Payer: 59 | Attending: Emergency Medicine | Admitting: Emergency Medicine

## 2015-09-04 ENCOUNTER — Encounter (HOSPITAL_COMMUNITY): Payer: Self-pay | Admitting: *Deleted

## 2015-09-04 DIAGNOSIS — R0789 Other chest pain: Secondary | ICD-10-CM | POA: Diagnosis not present

## 2015-09-04 DIAGNOSIS — Z3202 Encounter for pregnancy test, result negative: Secondary | ICD-10-CM | POA: Diagnosis not present

## 2015-09-04 DIAGNOSIS — R2241 Localized swelling, mass and lump, right lower limb: Secondary | ICD-10-CM | POA: Insufficient documentation

## 2015-09-04 DIAGNOSIS — Z79818 Long term (current) use of other agents affecting estrogen receptors and estrogen levels: Secondary | ICD-10-CM | POA: Diagnosis not present

## 2015-09-04 DIAGNOSIS — R079 Chest pain, unspecified: Secondary | ICD-10-CM | POA: Diagnosis present

## 2015-09-04 DIAGNOSIS — J45909 Unspecified asthma, uncomplicated: Secondary | ICD-10-CM | POA: Diagnosis not present

## 2015-09-04 LAB — CBC
HCT: 39.8 % (ref 36.0–46.0)
Hemoglobin: 13.4 g/dL (ref 12.0–15.0)
MCH: 29 pg (ref 26.0–34.0)
MCHC: 33.7 g/dL (ref 30.0–36.0)
MCV: 86.1 fL (ref 78.0–100.0)
Platelets: 259 10*3/uL (ref 150–400)
RBC: 4.62 MIL/uL (ref 3.87–5.11)
RDW: 13.2 % (ref 11.5–15.5)
WBC: 9.5 10*3/uL (ref 4.0–10.5)

## 2015-09-04 LAB — BASIC METABOLIC PANEL
Anion gap: 9 (ref 5–15)
BUN: 9 mg/dL (ref 6–20)
CALCIUM: 9.7 mg/dL (ref 8.9–10.3)
CO2: 23 mmol/L (ref 22–32)
Chloride: 105 mmol/L (ref 101–111)
Creatinine, Ser: 0.85 mg/dL (ref 0.44–1.00)
GFR calc Af Amer: >60 mL/min (ref >60)
GLUCOSE: 119 mg/dL — AB (ref 65–99)
Potassium: 3.9 mmol/L (ref 3.5–5.1)
Sodium: 137 mmol/L (ref 135–145)

## 2015-09-04 LAB — I-STAT BETA HCG BLOOD, ED (MC, WL, AP ONLY): I-stat hCG, quantitative: <5 m[IU]/mL (ref <5)

## 2015-09-04 LAB — I-STAT TROPONIN, ED: TROPONIN I, POC: 0 ng/mL (ref 0.00–0.08)

## 2015-09-04 NOTE — ED Notes (Signed)
PT states started having chest pain about 4pm and then started having pain radiating down left arm.  No back pain, no cough.  Pt sts nausea and feels like legs are tingling.  Constant.

## 2015-09-05 LAB — HEPATIC FUNCTION PANEL
ALBUMIN: 4 g/dL (ref 3.5–5.0)
ALK PHOS: 123 U/L (ref 38–126)
ALT: 40 U/L (ref 14–54)
AST: 27 U/L (ref 15–41)
BILIRUBIN TOTAL: 0.5 mg/dL (ref 0.3–1.2)
Bilirubin, Direct: <0.1 mg/dL — ABNORMAL LOW (ref 0.1–0.5)
Total Protein: 7.6 g/dL (ref 6.5–8.1)

## 2015-09-05 LAB — D-DIMER, QUANTITATIVE (NOT AT ARMC)

## 2015-09-05 LAB — LIPASE, BLOOD: LIPASE: 29 U/L (ref 11–51)

## 2015-09-05 MED ORDER — IBUPROFEN 800 MG PO TABS
800.0000 mg | ORAL_TABLET | Freq: Once | ORAL | Status: AC
  Administered 2015-09-05: 800 mg via ORAL
  Filled 2015-09-05: qty 1

## 2015-09-05 MED ORDER — IBUPROFEN 800 MG PO TABS: 800.0000 mg | ORAL_TABLET | Freq: Three times a day (TID) | ORAL | Status: DC | PRN

## 2015-09-05 NOTE — ED Provider Notes (Signed)
By signing my name below, I, [name], attest that this documentation has been prepared under the direction and in the presence of [place], DO . Electronically Signed: Evelene Henderson, Scribe. 09/05/2015. 12:47 AM.  TIME SEEN: 12:47 AM   CHIEF COMPLAINT:  Chief Complaint  Patient presents with  . Chest Pain     HPI:   [name] is a 30 y.o. female who presents to the Emergency Department complaining of constant, central CP since ~ 1600/1630 yesterday (09/04/15) that waxes and wanes in severity. Pt states she was eating at symptom onset. Pt describes a squeezing pain with intermittent episodes of sharp pain. She also notes radiation of pain to her left shoulder. Pt reports h/o similar squeezing pain that lasted a few minutes and resolved on its own. She deneis smoking hx, h/o HTN, HLD and DM. Pt reports FHx of CAD at a young age; uncle, believes he was in his thirties. Pt notes mild swelling and pain to her RLE She denies recent surgeries/hospitalization, recent fractures, long periods of immobilzation, and  smoking hx. She is currently on BCP. No SOB. Patient reports pain is slightly pleuritic. No other aggravating or relieving factors. States she thinks this could be her gallbladder. She is not having any pain in her abdomen. No fevers. No vomiting. Has had previous abdominal ultrasounds which have never shown gallstones.  ROS: See HPI Constitutional: no fever  Eyes: no drainage  ENT: no runny nose   Cardiovascular:  chest pain  Resp: no SOB  GI: no vomiting GU: no dysuria Integumentary: no rash  Allergy: no hives  Musculoskeletal: no leg swelling  Neurological: no slurred speech ROS otherwise negative  PAST MEDICAL HISTORY/PAST SURGICAL HISTORY:  Past Medical History  Diagnosis Date  . Complication of anesthesia   . PONV (postoperative nausea and vomiting)   . 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 Authorizing Provider  aspirin-acetaminophen-caffeine (EXCEDRIN MIGRAINE) [phone] MG tablet Take 2 tablets by mouth every 6 (six) hours as needed for headache.   Yes Historical Provider, MD  calcium carbonate (TUMS - DOSED IN MG ELEMENTAL CALCIUM) 500 MG chewable tablet Chew 2 tablets by mouth 2 (two) times daily as needed for indigestion or heartburn.   Yes Historical Provider, MD  ibuprofen (ADVIL,MOTRIN) 600 MG tablet Take 1 tablet (600 mg total) by mouth every 6 (six) hours as needed. Patient taking differently: Take 600 mg by mouth every 6 (six) hours as needed for headache.  12/19/13  Yes Newton Pigg, MD  norethindrone-ethinyl estradiol (CYCLAFEM,ALYACEN) 0.5/0.75/1-35 MG-MCG tablet Take 1 tablet by mouth every evening.   Yes Historical Provider, MD    ALLERGIES:  Allergies  Allergen Reactions  . Percocet [Oxycodone-Acetaminophen] Nausea And Vomiting    SOCIAL HISTORY:  Social History  Substance Use Topics  . Smoking status: Never Smoker   . Smokeless tobacco: Not on file  . Alcohol Use: No    FAMILY HISTORY: No family history on file.  EXAM: BP 156/84 mmHg  Pulse 70  Temp(Src) 98.9 F (37.2 C) (Oral)  Resp 16  Ht 5\' 8"  (1.727 m)  Wt 285 lb (129.275 kg)  BMI 43.34 kg/m2  SpO2 100% CONSTITUTIONAL: Alert and oriented and responds appropriately to questions. Well-appearing; well-nourished, Obese HEAD: Normocephalic EYES: Conjunctivae clear, PERRL ENT: normal nose; no rhinorrhea; moist mucous membranes NECK: Supple, no meningismus, no LAD  CARD: RRR; S1 and S2 appreciated; no murmurs, no clicks, no rubs, no gallops CHEST: TTP without crepitus, step off, or deformities  RESP: Normal chest excursion without splinting or tachypnea; breath sounds clear and equal bilaterally; no wheezes, no rhonchi, no rales, no hypoxia or respiratory distress, speaking full sentences ABD/GI: Normal bowel sounds; non-distended; soft, non-tender,  no rebound, no guarding, no peritoneal signs, Negative Murphy sign BACK:  The back appears normal and is non-tender to palpation, there is no CVA tenderness EXT: Normal ROM in all joints; non-tender to palpation; no edema; normal capillary refill; no cyanosis. Small area of ecchymosis to the medial right proximal calf with tenderness; no significant swelling SKIN: Normal color for age and race; warm; no rash NEURO: Moves all extremities equally, sensation to light touch intact diffusely, cranial nerves II through XII intact PSYCH: The patient's mood and manner are appropriate. Grooming and personal hygiene are appropriate.    EKG Interpretation  Date/Time:  Saturday September 04 2015 19:50:47 EDT Ventricular Rate:  70 PR Interval:  138 QRS Duration: 90 QT Interval:  386 QTC Calculation: 416 R Axis:   68 Text Interpretation:  Normal sinus rhythm with sinus arrhythmia Normal ECG No old tracing to compare Confirmed by Mikaella Escalona,  DO, Budd Freiermuth ([id number]) on 09/05/2015 12:30:26 AM       MEDICAL DECISION MAKING: Patient here with what appears to be chest wall pain. Pain is been constant since 4 PM. Doubt ACS. EKG shows no ischemic abnormality. Troponin ordered in triage is unremarkable. Patient is concerned as compare gallbladder. My suspicion for this is very low. She is not tender to palpation at all in her right upper quadrant epigastric region. She's not having any nausea or vomiting. States the pain started after eating but she doesn't notice any changes eating. She has had ultrasounds in the past that showed normal gallbladder without gallstones. Discussed with her that we can add on LFTs and lipase but do not feel this time she needs an emergent ultrasound of her gallbladder. Given there is some pleuritic component of her pain and she is obese, will obtain a d-dimer. Suspicion for pulmonary embolus is low. Will give ibuprofen and reassess.  ED PROGRESS: His, lipase normal. D-dimer negative. She is not  pregnant. I feel she is safe to be discharged. Her chest x-ray is clear. Pain improved with ibuprofen. Discussed with patient that I think this is musculoskeletal. We'll discharge with prescription for ibuprofen. Recommended outpatient follow-up.   At this time, I do not feel there is any life-threatening condition present. I have reviewed and discussed all results (EKG, imaging, lab, urine as appropriate), exam findings with patient. I have reviewed nursing notes and appropriate previous records.  I feel the patient is safe to be discharged home without further emergent workup. Discussed usual and customary return precautions. Patient and family (if present) verbalize understanding and are comfortable with this plan.  Patient will follow-up with their primary care provider. If they do not have a primary care provider, information for follow-up has been provided to them. All questions have been answered.    I personally performed the services described in this documentation, which was scribed in my presence. The recorded information has been reviewed and is accurate.    [place], DO 09/05/15 [phone]

## 2015-09-05 NOTE — Discharge Instructions (Signed)

## 2016-07-03 DIAGNOSIS — Z23 Encounter for immunization: Secondary | ICD-10-CM | POA: Diagnosis not present

## 2016-08-08 DIAGNOSIS — D225 Melanocytic nevi of trunk: Secondary | ICD-10-CM | POA: Diagnosis not present

## 2016-08-08 DIAGNOSIS — D2361 Other benign neoplasm of skin of right upper limb, including shoulder: Secondary | ICD-10-CM | POA: Diagnosis not present

## 2016-10-09 DIAGNOSIS — Z7189 Other specified counseling: Secondary | ICD-10-CM | POA: Diagnosis not present

## 2016-10-10 DIAGNOSIS — Z713 Dietary counseling and surveillance: Secondary | ICD-10-CM | POA: Diagnosis not present

## 2017-01-01 DIAGNOSIS — R0789 Other chest pain: Secondary | ICD-10-CM | POA: Diagnosis not present

## 2017-05-30 DIAGNOSIS — Z01419 Encounter for gynecological examination (general) (routine) without abnormal findings: Secondary | ICD-10-CM | POA: Diagnosis not present

## 2017-07-15 ENCOUNTER — Emergency Department (HOSPITAL_COMMUNITY): Payer: 59

## 2017-07-15 ENCOUNTER — Encounter (HOSPITAL_COMMUNITY): Payer: Self-pay | Admitting: Emergency Medicine

## 2017-07-15 ENCOUNTER — Emergency Department (HOSPITAL_COMMUNITY)
Admission: EM | Admit: 2017-07-15 | Discharge: 2017-07-15 | Disposition: A | Discharge status: Home / Self Care | Payer: 59 | Attending: Physician Assistant | Admitting: Physician Assistant

## 2017-07-15 DIAGNOSIS — J45909 Unspecified asthma, uncomplicated: Secondary | ICD-10-CM | POA: Insufficient documentation

## 2017-07-15 DIAGNOSIS — Y92411 Interstate highway as the place of occurrence of the external cause: Secondary | ICD-10-CM | POA: Insufficient documentation

## 2017-07-15 DIAGNOSIS — M542 Cervicalgia: Secondary | ICD-10-CM | POA: Diagnosis not present

## 2017-07-15 DIAGNOSIS — S161XXA Strain of muscle, fascia and tendon at neck level, initial encounter: Secondary | ICD-10-CM | POA: Diagnosis not present

## 2017-07-15 DIAGNOSIS — M25551 Pain in right hip: Secondary | ICD-10-CM | POA: Diagnosis not present

## 2017-07-15 DIAGNOSIS — S4991XA Unspecified injury of right shoulder and upper arm, initial encounter: Secondary | ICD-10-CM | POA: Diagnosis not present

## 2017-07-15 DIAGNOSIS — R0789 Other chest pain: Secondary | ICD-10-CM | POA: Diagnosis not present

## 2017-07-15 DIAGNOSIS — M25511 Pain in right shoulder: Secondary | ICD-10-CM | POA: Diagnosis not present

## 2017-07-15 DIAGNOSIS — Y939 Activity, unspecified: Secondary | ICD-10-CM | POA: Diagnosis not present

## 2017-07-15 DIAGNOSIS — S199XXA Unspecified injury of neck, initial encounter: Secondary | ICD-10-CM | POA: Diagnosis not present

## 2017-07-15 DIAGNOSIS — Z79899 Other long term (current) drug therapy: Secondary | ICD-10-CM | POA: Diagnosis not present

## 2017-07-15 DIAGNOSIS — Y999 Unspecified external cause status: Secondary | ICD-10-CM | POA: Diagnosis not present

## 2017-07-15 LAB — I-STAT BETA HCG BLOOD, ED (MC, WL, AP ONLY): I-stat hCG, quantitative: <5 m[IU]/mL (ref <5)

## 2017-07-15 MED ORDER — FENTANYL CITRATE (PF) 100 MCG/2ML IJ SOLN
25.0000 ug | Freq: Once | INTRAMUSCULAR | Status: AC
  Administered 2017-07-15: 25 ug via INTRAMUSCULAR
  Filled 2017-07-15: qty 2

## 2017-07-15 MED ORDER — METHOCARBAMOL 500 MG PO TABS: 500.0000 mg | ORAL_TABLET | Freq: Two times a day (BID) | ORAL | 0 refills | Status: DC | PRN

## 2017-07-15 MED ORDER — ACETAMINOPHEN 325 MG PO TABS
650.0000 mg | ORAL_TABLET | Freq: Once | ORAL | Status: AC
Start: 2017-07-15 — End: 2017-07-15
  Administered 2017-07-15: 650 mg via ORAL
  Filled 2017-07-15: qty 2

## 2017-07-15 NOTE — ED Notes (Signed)
Patient transported to X-ray 
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
## 2017-07-15 NOTE — Discharge Instructions (Signed)
You may take over the counter Tylenol and Ibuprofen for your muscle pain. You may also use warm and cold compresses to help with your symptoms. You were also given a prescription for a muscle relaxer. You should avoid working, driving, operating heavy machinery, or taking care of children while you are taking this medication as it can be sedating.  Please follow up with your primary doctor within the next 7-10 days. Please return to the ER sooner if you have any new or worsening symptoms.
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
## 2018-03-05 DIAGNOSIS — R112 Nausea with vomiting, unspecified: Secondary | ICD-10-CM | POA: Diagnosis not present

## 2018-03-17 DIAGNOSIS — R1011 Right upper quadrant pain: Secondary | ICD-10-CM | POA: Diagnosis not present

## 2018-03-17 DIAGNOSIS — R1110 Vomiting, unspecified: Secondary | ICD-10-CM | POA: Diagnosis not present

## 2018-03-17 DIAGNOSIS — R112 Nausea with vomiting, unspecified: Secondary | ICD-10-CM | POA: Diagnosis not present

## 2018-03-17 DIAGNOSIS — K219 Gastro-esophageal reflux disease without esophagitis: Secondary | ICD-10-CM | POA: Diagnosis not present

## 2018-03-17 DIAGNOSIS — K8020 Calculus of gallbladder without cholecystitis without obstruction: Secondary | ICD-10-CM | POA: Diagnosis not present

## 2018-03-18 DIAGNOSIS — R112 Nausea with vomiting, unspecified: Secondary | ICD-10-CM | POA: Diagnosis not present

## 2018-03-18 DIAGNOSIS — R1110 Vomiting, unspecified: Secondary | ICD-10-CM | POA: Diagnosis not present

## 2018-04-02 ENCOUNTER — Other Ambulatory Visit: Payer: Self-pay | Admitting: Surgery

## 2018-04-02 DIAGNOSIS — K8020 Calculus of gallbladder without cholecystitis without obstruction: Secondary | ICD-10-CM | POA: Diagnosis not present

## 2018-04-05 ENCOUNTER — Other Ambulatory Visit: Payer: Self-pay

## 2018-04-05 ENCOUNTER — Encounter (HOSPITAL_COMMUNITY): Payer: Self-pay | Admitting: *Deleted

## 2018-04-05 NOTE — Progress Notes (Signed)
Pt denies SOB, chest pain, and being under the care of a cardiologist. Pt denies having a stress test, echo and cardiac cath. Pt stated that an EKG was performed at [facility]; records requested. Pt denies recent labs. Pt made aware to stop taking Nyquil,  Aspirin, vitamins, fish oil and herbal medications. Do not take any NSAIDs ie: Ibuprofen, Advil, Naproxen (Aleve), Mobic, Motrin, BC and Goody Powder. Pt made aware of ERAS protocol ( pt provided with detailed instructions of clears liquids allowed ) until 9:00 a.m for 12:00 P.M. surgery. Pt verbalized understanding of all pre-op instructions (lvm repeating instructions as requested on pt cell phone).

## 2018-04-08 MED ORDER — CIPROFLOXACIN IN D5W 400 MG/200ML IV SOLN
400.0000 mg | INTRAVENOUS | Status: AC
  Administered 2018-04-09: 400 mg via INTRAVENOUS
  Filled 2018-04-08: qty 200

## 2018-04-08 NOTE — H&P (Signed)
[name] Documented: 04/02/2018 10:31 AM Location: [facility] Patient #: [id number] DOB: 13-Jul-1985 Married / Language: [name] / Race: White Female   History of Present Illness ([name] A. Ninfa Linden MD; 04/02/2018 11:03 AM) The patient is a 32 year old female who presents for evaluation of gall stones. This is a 31 year old female who is a self-referral for known gallstones. She has been having attacks of epigastric and right upper quadrant abdominal pain hurting too to the back and to the chest for several years. She describes the pain as sharp. It is related to fatty meals. She has had some episodes of nausea and vomiting. Most recently, she underwent a CT scan showing her to have gallstones. There were no other abnormal findings. She is currently pain-free today. Her bowel movements are normal. The pain is described when it happens as moderate to severe and sharp. She is otherwise healthy.   Diagnostic Studies History [name], CMA; 04/02/2018 10:31 AM) Colonoscopy  never  Allergies [name], CMA; 04/02/2018 10:32 AM) Percocet *ANALGESICS - OPIOID*   Medication History [name], CMA; 04/02/2018 10:33 AM) Meloxicam (7.5MG  Tablet, Oral) Active. Ondansetron (4MG  Tablet Disint, Oral) Active. Medications Reconciled  Family History [name], CMA; 04/02/2018 10:31 AM) Arthritis  Mother.  Pregnancy / Birth History [name], CMA; 04/02/2018 10:31 AM) Regular periods   Other Problems [name], CMA; 04/02/2018 10:31 AM) Cholelithiasis  Gastroesophageal Reflux Disease     Review of Systems ([name] CMA; 04/02/2018 10:31 AM) General Present- Weight Loss. Not Present- Appetite Loss, Chills, Fatigue, Fever, Night Sweats and Weight Gain. Skin Not Present- Change in Wart/Mole, Dryness, Hives, Jaundice, New Lesions, Non-Healing Wounds, Rash and Ulcer. Cardiovascular Not Present- Chest Pain, Difficulty Breathing  Lying Down, Leg Cramps, Palpitations, Rapid Heart Rate, Shortness of Breath and Swelling of Extremities. Gastrointestinal Present- Excessive gas, Nausea and Vomiting. Not Present- Abdominal Pain, Bloating, Bloody Stool, Change in Bowel Habits, Chronic diarrhea, Constipation, Difficulty Swallowing, Gets full quickly at meals, Hemorrhoids, Indigestion and Rectal Pain. Endocrine Not Present- Cold Intolerance, Excessive Hunger, Hair Changes, Heat Intolerance, Hot flashes and New Diabetes. Hematology Not Present- Blood Thinners, Easy Bruising, Excessive bleeding, Gland problems, HIV and Persistent Infections.  Vitals ([name] CMA; 04/02/2018 10:32 AM) 04/02/2018 10:32 AM Weight: 285.4 lb Height: 68in Body Surface Area: 2.38 m Body Mass Index: 43.39 kg/m  Temp.: 99.18F(Oral)  Pulse: 92 (Regular)  BP: 124/76 (Sitting, Left Arm, Standard)       Physical Exam (Oletta Buehring A. Ninfa Linden MD; 04/02/2018 11:04 AM) General Mental Status-Alert. General Appearance-Consistent with stated age. Hydration-Well hydrated. Voice-Normal.  Head and Neck Head-normocephalic, atraumatic with no lesions or palpable masses.  Eye Eyeball - Bilateral-Extraocular movements intact. Sclera/Conjunctiva - Bilateral-No scleral icterus.  Chest and Lung Exam Chest and lung exam reveals -quiet, even and easy respiratory effort with no use of accessory muscles and on auscultation, normal breath sounds, no adventitious sounds and normal vocal resonance. Inspection Chest Wall - Normal. Back - normal.  Cardiovascular Cardiovascular examination reveals -on palpation PMI is normal in location and amplitude, no palpable S3 or S4. Normal cardiac borders., normal heart sounds, regular rate and rhythm with no murmurs, carotid auscultation reveals no bruits and normal pedal pulses bilaterally.  Abdomen Inspection Inspection of the abdomen reveals - No Hernias. Skin - Scar - no surgical  scars. Palpation/Percussion Palpation and Percussion of the abdomen reveal - Soft, Non Tender, No Rebound tenderness, No Rigidity (guarding) and No hepatosplenomegaly. Auscultation Auscultation of the abdomen reveals - Bowel sounds  normal.  Neurologic - Did not examine.  Musculoskeletal - Did not examine.    Assessment & Plan (Jeiden Daughtridge A. Ninfa Linden MD; 04/02/2018 11:04 AM) SYMPTOMATIC CHOLELITHIASIS (K80.20) Impression: I discussed the diagnosis with her and her husband. Laparoscopic cholecystectomy is recommended and she would like to proceed with surgery. I gave him literature regarding the surgery. I discussed the surgical procedure in detail. We discussed the risk which includes but is not limited to bleeding, infection, injury to surrounding structures, need to convert to an open procedure, bile duct injury, bile leak, cardiopulmonary issues, postoperative recovery, etc. She understands and wishes to proceed with surgery

## 2018-04-09 ENCOUNTER — Ambulatory Visit (HOSPITAL_COMMUNITY): Payer: 59 | Admitting: Anesthesiology

## 2018-04-09 ENCOUNTER — Ambulatory Visit (HOSPITAL_COMMUNITY)
Admission: RE | Admit: 2018-04-09 | Discharge: 2018-04-09 | Disposition: A | Discharge status: Home / Self Care | Payer: 59 | Source: Ambulatory Visit | Attending: Surgery | Admitting: Surgery

## 2018-04-09 ENCOUNTER — Encounter (HOSPITAL_COMMUNITY): Payer: Self-pay

## 2018-04-09 ENCOUNTER — Encounter (HOSPITAL_COMMUNITY)
Admission: RE | Disposition: A | Discharge status: Home / Self Care | Payer: Self-pay | Source: Ambulatory Visit | Attending: Surgery

## 2018-04-09 ENCOUNTER — Other Ambulatory Visit: Payer: Self-pay

## 2018-04-09 DIAGNOSIS — K8010 Calculus of gallbladder with chronic cholecystitis without obstruction: Secondary | ICD-10-CM | POA: Insufficient documentation

## 2018-04-09 DIAGNOSIS — Z6841 Body Mass Index (BMI) 40.0 and over, adult: Secondary | ICD-10-CM | POA: Diagnosis not present

## 2018-04-09 DIAGNOSIS — Z79899 Other long term (current) drug therapy: Secondary | ICD-10-CM | POA: Insufficient documentation

## 2018-04-09 DIAGNOSIS — K8020 Calculus of gallbladder without cholecystitis without obstruction: Secondary | ICD-10-CM | POA: Diagnosis present

## 2018-04-09 DIAGNOSIS — K219 Gastro-esophageal reflux disease without esophagitis: Secondary | ICD-10-CM | POA: Insufficient documentation

## 2018-04-09 DIAGNOSIS — Z793 Long term (current) use of hormonal contraceptives: Secondary | ICD-10-CM | POA: Diagnosis not present

## 2018-04-09 DIAGNOSIS — J45909 Unspecified asthma, uncomplicated: Secondary | ICD-10-CM | POA: Diagnosis not present

## 2018-04-09 HISTORY — DX: Gastro-esophageal reflux disease without esophagitis: K21.9

## 2018-04-09 HISTORY — PX: CHOLECYSTECTOMY: SHX55

## 2018-04-09 HISTORY — DX: Anxiety disorder, unspecified: F41.9

## 2018-04-09 HISTORY — DX: Headache: R51

## 2018-04-09 HISTORY — DX: Obesity, unspecified: E66.9

## 2018-04-09 HISTORY — DX: Calculus of gallbladder without cholecystitis without obstruction: K80.20

## 2018-04-09 HISTORY — DX: Headache, unspecified: R51.9

## 2018-04-09 LAB — CBC
HCT: 42.6 % (ref 36.0–46.0)
HEMOGLOBIN: 13.2 g/dL (ref 12.0–15.0)
MCH: 27.2 pg (ref 26.0–34.0)
MCHC: 31 g/dL (ref 30.0–36.0)
MCV: 87.8 fL (ref 80.0–100.0)
NRBC: 0 % (ref 0.0–0.2)
Platelets: 278 10*3/uL (ref 150–400)
RBC: 4.85 MIL/uL (ref 3.87–5.11)
RDW: 12.5 % (ref 11.5–15.5)
WBC: 9.8 10*3/uL (ref 4.0–10.5)

## 2018-04-09 LAB — POCT PREGNANCY, URINE: PREG TEST UR: NEGATIVE

## 2018-04-09 SURGERY — LAPAROSCOPIC CHOLECYSTECTOMY
Anesthesia: General | Site: Abdomen

## 2018-04-09 MED ORDER — HYDROMORPHONE HCL 1 MG/ML IJ SOLN
INTRAMUSCULAR | Status: AC
  Administered 2018-04-09: 0.5 mg via INTRAVENOUS
  Filled 2018-04-09: qty 1

## 2018-04-09 MED ORDER — MIDAZOLAM HCL 5 MG/5ML IJ SOLN
INTRAMUSCULAR | Status: DC | PRN
  Administered 2018-04-09: 2 mg via INTRAVENOUS

## 2018-04-09 MED ORDER — LACTATED RINGERS IV SOLN
INTRAVENOUS | Status: DC
  Administered 2018-04-09: 11:00:00 via INTRAVENOUS

## 2018-04-09 MED ORDER — CHLORHEXIDINE GLUCONATE CLOTH 2 % EX PADS: 6.0000 | MEDICATED_PAD | Freq: Once | CUTANEOUS | Status: DC

## 2018-04-09 MED ORDER — LACTATED RINGERS IV SOLN
INTRAVENOUS | Status: DC | PRN
  Administered 2018-04-09: 11:00:00 via INTRAVENOUS

## 2018-04-09 MED ORDER — TRAMADOL HCL 50 MG PO TABS: 50.0000 mg | ORAL_TABLET | Freq: Four times a day (QID) | ORAL | 0 refills | Status: DC | PRN

## 2018-04-09 MED ORDER — TRAMADOL HCL 50 MG PO TABS
ORAL_TABLET | ORAL | Status: AC
  Filled 2018-04-09: qty 1

## 2018-04-09 MED ORDER — LIDOCAINE HCL (CARDIAC) PF 100 MG/5ML IV SOSY
PREFILLED_SYRINGE | INTRAVENOUS | Status: DC | PRN
  Administered 2018-04-09: 30 mg via INTRAVENOUS

## 2018-04-09 MED ORDER — ACETAMINOPHEN 500 MG PO TABS
ORAL_TABLET | ORAL | Status: AC
  Administered 2018-04-09: 1000 mg via ORAL
  Filled 2018-04-09: qty 2

## 2018-04-09 MED ORDER — BUPIVACAINE-EPINEPHRINE (PF) 0.25% -1:200000 IJ SOLN
INTRAMUSCULAR | Status: AC
  Filled 2018-04-09: qty 30

## 2018-04-09 MED ORDER — ROCURONIUM BROMIDE 100 MG/10ML IV SOLN
INTRAVENOUS | Status: DC | PRN
  Administered 2018-04-09: 50 mg via INTRAVENOUS

## 2018-04-09 MED ORDER — TRAMADOL HCL 50 MG PO TABS
50.0000 mg | ORAL_TABLET | Freq: Once | ORAL | Status: AC
  Administered 2018-04-09: 50 mg via ORAL

## 2018-04-09 MED ORDER — GABAPENTIN 300 MG PO CAPS
300.0000 mg | ORAL_CAPSULE | ORAL | Status: AC
  Administered 2018-04-09: 300 mg via ORAL

## 2018-04-09 MED ORDER — FENTANYL CITRATE (PF) 100 MCG/2ML IJ SOLN
INTRAMUSCULAR | Status: DC | PRN
  Administered 2018-04-09: 100 ug via INTRAVENOUS
  Administered 2018-04-09: 150 ug via INTRAVENOUS

## 2018-04-09 MED ORDER — SUGAMMADEX SODIUM 200 MG/2ML IV SOLN
INTRAVENOUS | Status: DC | PRN
  Administered 2018-04-09: 300 mg via INTRAVENOUS

## 2018-04-09 MED ORDER — FENTANYL CITRATE (PF) 250 MCG/5ML IJ SOLN
INTRAMUSCULAR | Status: AC
  Filled 2018-04-09: qty 5

## 2018-04-09 MED ORDER — BUPIVACAINE-EPINEPHRINE 0.25% -1:200000 IJ SOLN
INTRAMUSCULAR | Status: DC | PRN
  Administered 2018-04-09: 30 mL

## 2018-04-09 MED ORDER — 0.9 % SODIUM CHLORIDE (POUR BTL) OPTIME
TOPICAL | Status: DC | PRN
  Administered 2018-04-09: 1000 mL

## 2018-04-09 MED ORDER — SODIUM CHLORIDE 0.9 % IR SOLN
Status: DC | PRN
  Administered 2018-04-09: 1000 mL

## 2018-04-09 MED ORDER — GABAPENTIN 300 MG PO CAPS
ORAL_CAPSULE | ORAL | Status: AC
  Administered 2018-04-09: 300 mg via ORAL
  Filled 2018-04-09: qty 1

## 2018-04-09 MED ORDER — HYDROMORPHONE HCL 1 MG/ML IJ SOLN
0.2500 mg | INTRAMUSCULAR | Status: DC | PRN
  Administered 2018-04-09 (×4): 0.5 mg via INTRAVENOUS

## 2018-04-09 MED ORDER — SCOPOLAMINE 1 MG/3DAYS TD PT72
MEDICATED_PATCH | TRANSDERMAL | Status: AC
  Administered 2018-04-09: 1.5 mg
  Filled 2018-04-09: qty 1

## 2018-04-09 MED ORDER — ONDANSETRON HCL 4 MG/2ML IJ SOLN
INTRAMUSCULAR | Status: DC | PRN
  Administered 2018-04-09: 4 mg via INTRAVENOUS

## 2018-04-09 MED ORDER — PROPOFOL 10 MG/ML IV BOLUS
INTRAVENOUS | Status: AC
  Filled 2018-04-09: qty 40

## 2018-04-09 MED ORDER — ACETAMINOPHEN 500 MG PO TABS
1000.0000 mg | ORAL_TABLET | ORAL | Status: AC
  Administered 2018-04-09: 1000 mg via ORAL

## 2018-04-09 MED ORDER — PROPOFOL 10 MG/ML IV BOLUS
INTRAVENOUS | Status: DC | PRN
  Administered 2018-04-09: 200 mg via INTRAVENOUS

## 2018-04-09 MED ORDER — DEXAMETHASONE SODIUM PHOSPHATE 10 MG/ML IJ SOLN
INTRAMUSCULAR | Status: DC | PRN
  Administered 2018-04-09: 10 mg via INTRAVENOUS

## 2018-04-09 SURGICAL SUPPLY — 44 items
APPLIER CLIP 5 13 M/L LIGAMAX5 (MISCELLANEOUS) ×3
BLADE CLIPPER SURG (BLADE) ×3 IMPLANT
CANISTER SUCT 3000ML PPV (MISCELLANEOUS) ×3 IMPLANT
CHLORAPREP W/TINT 26ML (MISCELLANEOUS) ×3 IMPLANT
CLIP APPLIE 5 13 M/L LIGAMAX5 (MISCELLANEOUS) ×1 IMPLANT
CONT SPEC 4OZ CLIKSEAL STRL BL (MISCELLANEOUS) ×3 IMPLANT
COVER SURGICAL LIGHT HANDLE (MISCELLANEOUS) ×3 IMPLANT
COVER WAND RF STERILE (DRAPES) ×3 IMPLANT
DERMABOND ADHESIVE PROPEN (GAUZE/BANDAGES/DRESSINGS) ×2
DERMABOND ADVANCED (GAUZE/BANDAGES/DRESSINGS) ×2
DERMABOND ADVANCED .7 DNX12 (GAUZE/BANDAGES/DRESSINGS) ×1 IMPLANT
DERMABOND ADVANCED .7 DNX6 (GAUZE/BANDAGES/DRESSINGS) ×1 IMPLANT
ELECT REM PT RETURN 9FT ADLT (ELECTROSURGICAL) ×3
ELECTRODE REM PT RTRN 9FT ADLT (ELECTROSURGICAL) ×1 IMPLANT
GLOVE BIO SURGEON STRL SZ 6.5 (GLOVE) ×2 IMPLANT
GLOVE BIO SURGEONS STRL SZ 6.5 (GLOVE) ×1
GLOVE BIOGEL PI IND STRL 6.5 (GLOVE) ×1 IMPLANT
GLOVE BIOGEL PI IND STRL 7.0 (GLOVE) ×1 IMPLANT
GLOVE BIOGEL PI IND STRL 7.5 (GLOVE) ×1 IMPLANT
GLOVE BIOGEL PI INDICATOR 6.5 (GLOVE) ×2
GLOVE BIOGEL PI INDICATOR 7.0 (GLOVE) ×2
GLOVE BIOGEL PI INDICATOR 7.5 (GLOVE) ×2
GLOVE SURG SIGNA 7.5 PF LTX (GLOVE) ×3 IMPLANT
GOWN STRL REUS W/ TWL LRG LVL3 (GOWN DISPOSABLE) ×2 IMPLANT
GOWN STRL REUS W/ TWL XL LVL3 (GOWN DISPOSABLE) ×1 IMPLANT
GOWN STRL REUS W/TWL LRG LVL3 (GOWN DISPOSABLE) ×4
GOWN STRL REUS W/TWL XL LVL3 (GOWN DISPOSABLE) ×2
KIT BASIN OR (CUSTOM PROCEDURE TRAY) ×3 IMPLANT
KIT TURNOVER KIT B (KITS) ×3 IMPLANT
NS IRRIG 1000ML POUR BTL (IV SOLUTION) ×3 IMPLANT
PAD ARMBOARD 7.5X6 YLW CONV (MISCELLANEOUS) ×3 IMPLANT
POUCH SPECIMEN RETRIEVAL 10MM (ENDOMECHANICALS) ×3 IMPLANT
SCISSORS LAP 5X35 DISP (ENDOMECHANICALS) ×3 IMPLANT
SET IRRIG TUBING LAPAROSCOPIC (IRRIGATION / IRRIGATOR) ×3 IMPLANT
SLEEVE ENDOPATH XCEL 5M (ENDOMECHANICALS) ×6 IMPLANT
SPECIMEN JAR SMALL (MISCELLANEOUS) ×3 IMPLANT
SUT MNCRL AB 4-0 PS2 18 (SUTURE) ×3 IMPLANT
TOWEL OR 17X24 6PK STRL BLUE (TOWEL DISPOSABLE) ×3 IMPLANT
TOWEL OR 17X26 10 PK STRL BLUE (TOWEL DISPOSABLE) ×3 IMPLANT
TRAY LAPAROSCOPIC MC (CUSTOM PROCEDURE TRAY) ×3 IMPLANT
TROCAR XCEL BLUNT TIP 100MML (ENDOMECHANICALS) ×3 IMPLANT
TROCAR XCEL NON-BLD 5MMX100MML (ENDOMECHANICALS) ×3 IMPLANT
TUBING INSUFFLATION (TUBING) ×3 IMPLANT
WATER STERILE IRR 1000ML POUR (IV SOLUTION) ×3 IMPLANT

## 2018-04-09 NOTE — Anesthesia Preprocedure Evaluation (Addendum)
Anesthesia Evaluation  Patient identified by MRN, date of birth, ID band Patient awake    Reviewed: Allergy & Precautions, H&P , NPO status , Patient's Chart, lab work & pertinent test results  History of Anesthesia Complications (+) PONV  Airway Mallampati: II  TM Distance: >3 FB Neck ROM: Full    Dental no notable dental hx. (+) Teeth Intact, Dental Advisory Given   Pulmonary asthma ,    Pulmonary exam normal breath sounds clear to auscultation       Cardiovascular negative cardio ROS   Rhythm:Regular Rate:Normal     Neuro/Psych  Headaches, Anxiety    GI/Hepatic Neg liver ROS, GERD  Medicated and Controlled,  Endo/Other  Morbid obesity  Renal/GU negative Renal ROS  negative genitourinary   Musculoskeletal   Abdominal   Peds  Hematology negative hematology ROS (+)   Anesthesia Other Findings   Reproductive/Obstetrics negative OB ROS                            Anesthesia Physical Anesthesia Plan  ASA: II  Anesthesia Plan: General   Post-op Pain Management:    Induction: Intravenous  PONV Risk Score and Plan: 4 or greater and Ondansetron, Dexamethasone, Midazolam and Scopolamine patch - Pre-op  Airway Management Planned: Oral ETT  Additional Equipment:   Intra-op Plan:   Post-operative Plan: Extubation in OR  Informed Consent: I have reviewed the patients History and Physical, chart, labs and discussed the procedure including the risks, benefits and alternatives for the proposed anesthesia with the patient or authorized representative who has indicated his/her understanding and acceptance.   Dental advisory given  Plan Discussed with: CRNA  Anesthesia Plan Comments:        Anesthesia Quick Evaluation

## 2018-04-09 NOTE — Op Note (Signed)

## 2018-04-09 NOTE — Discharge Instructions (Signed)
[facility] ______[facility] °LAPAROSCOPIC SURGERY: POST OP INSTRUCTIONS °Always review your discharge instruction sheet given to you by the facility where your surgery was performed. °IF YOU HAVE DISABILITY OR FAMILY LEAVE FORMS, YOU MUST BRING THEM TO THE OFFICE FOR PROCESSING.   °DO NOT GIVE THEM TO YOUR DOCTOR. ° °1. A prescription for pain medication may be given to you upon discharge.  Take your pain medication as prescribed, if needed.  If narcotic pain medicine is not needed, then you may take acetaminophen (Tylenol) or ibuprofen (Advil) as needed. °2. Take your usually prescribed medications unless otherwise directed. °3. If you need a refill on your pain medication, please contact your pharmacy.  They will contact our office to request authorization. Prescriptions will not be filled after 5pm or on week-ends. °4. You should follow a light diet the first few days after arrival home, such as soup and crackers, etc.  Be sure to include lots of fluids daily. °5. Most patients will experience some swelling and bruising in the area of the incisions.  Ice packs will help.  Swelling and bruising can take several days to resolve.  °6. It is common to experience some constipation if taking pain medication after surgery.  Increasing fluid intake and taking a stool softener (such as Colace) will usually help or prevent this problem from occurring.  A mild laxative (Milk of Magnesia or Miralax) should be taken according to package instructions if there are no bowel movements after 48 hours. °7. Unless discharge instructions indicate otherwise, you may remove your bandages 24-48 hours after surgery, and you may shower at that time.  You may have steri-strips (small skin tapes) in place directly over the incision.  These strips should be left on the skin for 7-10 days.  If your surgeon used skin glue on the incision, you may shower in 24 hours.  The glue will flake off over the next 2-3 weeks.  Any sutures or  staples will be removed at the office during your follow-up visit. °8. ACTIVITIES:  You may resume regular (light) daily activities beginning the next day--such as daily self-care, walking, climbing stairs--gradually increasing activities as tolerated.  You may have sexual intercourse when it is comfortable.  Refrain from any heavy lifting or straining until approved by your doctor. °a. You may drive when you are no longer taking prescription pain medication, you can comfortably wear a seatbelt, and you can safely maneuver your car and apply brakes. °b. RETURN TO WORK:  __________________________________________________________ °9. You should see your doctor in the office for a follow-up appointment approximately 2-3 weeks after your surgery.  Make sure that you call for this appointment within a day or two after you arrive home to insure a convenient appointment time. °10. OTHER INSTRUCTIONS:OK TO SHOWER STARTING TOMORROW °11. ICE PACK, TYLENOL, IBUPROFEN ALSO FOR PAIN °12. NO LIFTING MORE THAN 15 TO 20 POUNDS FOR 2 WEEKS __________________________________________________________________________________________________________________________ __________________________________________________________________________________________________________________________ °WHEN TO CALL YOUR DOCTOR: °1. Fever over 101.0 °2. Inability to urinate °3. Continued bleeding from incision. °4. Increased pain, redness, or drainage from the incision. °5. Increasing abdominal pain ° °The clinic staff is available to answer your questions during regular business hours.  Please don’t hesitate to call and ask to speak to one of the nurses for clinical concerns.  If you have a medical emergency, go to the nearest emergency room or call 911.  A surgeon from [facility] is always on call at the hospital. °[address], [address], [place], [place]  [address] ? [address] [address], [place], [place]   [address] °[phone] ?  [phone] ? FAX [phone] °Web site: [email or web address] °

## 2018-04-09 NOTE — Interval H&P Note (Signed)
History and Physical Interval Note:no change in H and P  04/09/2018 10:00 AM  [name]  has presented today for surgery, with the diagnosis of SYMPTOMATIC CHOLELITHIASIS  The various methods of treatment have been discussed with the patient and family. After consideration of risks, benefits and other options for treatment, the patient has consented to  Procedure(s): LAPAROSCOPIC CHOLECYSTECTOMY (N/A) as a surgical intervention .  The patient's history has been reviewed, patient examined, no change in status, stable for surgery.  I have reviewed the patient's chart and labs.  Questions were answered to the patient's satisfaction.     [name] A

## 2018-04-09 NOTE — Anesthesia Postprocedure Evaluation (Signed)
Anesthesia Post Note  Patient: [name]  Procedure(s) Performed: LAPAROSCOPIC CHOLECYSTECTOMY (N/A Abdomen)     Patient location during evaluation: PACU Anesthesia Type: General Level of consciousness: awake and alert Pain management: pain level controlled Vital Signs Assessment: post-procedure vital signs reviewed and stable Respiratory status: spontaneous breathing, nonlabored ventilation and respiratory function stable Cardiovascular status: blood pressure returned to baseline and stable Postop Assessment: no apparent nausea or vomiting Anesthetic complications: no    Last Vitals:  Vitals:   04/09/18 1330 04/09/18 1404  BP: 134/79 139/79  Pulse: 93 (!) 104  Resp: 15 14  Temp:  36.6 C  SpO2: 99% 97%    Last Pain:  Vitals:   04/09/18 1330  TempSrc:   PainSc: Asleep                 Shilpa Bushee,W. EDMOND

## 2018-04-09 NOTE — Anesthesia Procedure Notes (Signed)
Procedure Name: Intubation Date/Time: 04/09/2018 11:46 AM Performed by: Eligha Bridegroom, CRNA Pre-anesthesia Checklist: Patient identified, Emergency Drugs available, Suction available, Patient being monitored and Timeout performed Patient Re-evaluated:Patient Re-evaluated prior to induction Oxygen Delivery Method: Circle system utilized Preoxygenation: Pre-oxygenation with 100% oxygen Induction Type: IV induction Ventilation: Mask ventilation without difficulty Laryngoscope Size: Miller and 4 Grade View: Grade I Tube type: Oral Tube size: 7.0 mm Airway Equipment and Method: Stylet Placement Confirmation: ETT inserted through vocal cords under direct vision,  positive ETCO2 and breath sounds checked- equal and bilateral Secured at: 21 cm Tube secured with: Tape Dental Injury: Teeth and Oropharynx as per pre-operative assessment

## 2018-04-09 NOTE — Transfer of Care (Signed)
Immediate Anesthesia Transfer of Care Note  Patient: [name]  Procedure(s) Performed: LAPAROSCOPIC CHOLECYSTECTOMY (N/A Abdomen)  Patient Location: PACU  Anesthesia Type:General  Level of Consciousness: awake, alert  and oriented  Airway & Oxygen Therapy: Patient Spontanous Breathing and Patient connected to nasal cannula oxygen  Post-op Assessment: Report given to RN and Post -op Vital signs reviewed and stable  Post vital signs: Reviewed and stable  Last Vitals:  Vitals Value Taken Time  BP 144/77 04/09/2018 12:33 PM  Temp    Pulse 103 04/09/2018 12:36 PM  Resp 16 04/09/2018 12:36 PM  SpO2 100 % 04/09/2018 12:36 PM  Vitals shown include unvalidated device data.  Last Pain:  Vitals:   04/09/18 1038  TempSrc:   PainSc: 1       Patients Stated Pain Goal: 5 (82/09/90 6893)  Complications: No apparent anesthesia complications

## 2018-04-10 ENCOUNTER — Encounter (HOSPITAL_COMMUNITY): Payer: Self-pay | Admitting: Surgery

## 2018-06-26 DIAGNOSIS — Z1389 Encounter for screening for other disorder: Secondary | ICD-10-CM | POA: Diagnosis not present

## 2018-06-26 DIAGNOSIS — Z01419 Encounter for gynecological examination (general) (routine) without abnormal findings: Secondary | ICD-10-CM | POA: Diagnosis not present

## 2018-06-26 DIAGNOSIS — Z124 Encounter for screening for malignant neoplasm of cervix: Secondary | ICD-10-CM | POA: Diagnosis not present

## 2018-06-26 DIAGNOSIS — Z130 Encounter for screening for diseases of the blood and blood-forming organs and certain disorders involving the immune mechanism: Secondary | ICD-10-CM | POA: Diagnosis not present

## 2018-07-22 DIAGNOSIS — N939 Abnormal uterine and vaginal bleeding, unspecified: Secondary | ICD-10-CM | POA: Diagnosis not present
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
## 2019-02-18 ENCOUNTER — Ambulatory Visit: Payer: Self-pay | Admitting: Allergy

## 2019-06-24 ENCOUNTER — Encounter: Payer: Self-pay | Admitting: Gastroenterology

## 2019-07-09 ENCOUNTER — Encounter: Payer: Self-pay | Admitting: Gastroenterology

## 2019-07-09 ENCOUNTER — Other Ambulatory Visit: Payer: Self-pay

## 2019-07-09 ENCOUNTER — Ambulatory Visit: Payer: 59 | Admitting: Gastroenterology

## 2019-07-09 VITALS — BP 118/78 | HR 98 | Temp 97.1°F | Ht 68.0 in | Wt 282.4 lb

## 2019-07-09 DIAGNOSIS — R1013 Epigastric pain: Secondary | ICD-10-CM | POA: Diagnosis not present

## 2019-07-09 DIAGNOSIS — R197 Diarrhea, unspecified: Secondary | ICD-10-CM

## 2019-07-09 DIAGNOSIS — Z01818 Encounter for other preprocedural examination: Secondary | ICD-10-CM | POA: Diagnosis not present

## 2019-07-09 DIAGNOSIS — K219 Gastro-esophageal reflux disease without esophagitis: Secondary | ICD-10-CM

## 2019-07-09 MED ORDER — PANTOPRAZOLE SODIUM 40 MG PO TBEC: 40.0000 mg | DELAYED_RELEASE_TABLET | Freq: Every day | ORAL | 6 refills | Status: DC

## 2019-07-09 NOTE — Patient Instructions (Signed)
If you are age 34 or older, your body mass index should be between 23-30. Your Body mass index is 42.93 kg/m. If this is out of the aforementioned range listed, please consider follow up with your Primary Care Provider.  If you are age 6 or younger, your body mass index should be between 19-25. Your Body mass index is 42.93 kg/m. If this is out of the aformentioned range listed, please consider follow up with your Primary Care Provider.   We have sent the following medications to your pharmacy for you to pick up at your convenience: Protonix 40 mg once daily.   You have been scheduled for an endoscopy. Please follow written instructions given to you at your visit today. If you use inhalers (even only as needed), please bring them with you on the day of your procedure. Your physician has requested that you go to www.[email or web address].com and enter the access code given to you at your visit today. This web site gives a general overview about your procedure. However, you should still follow specific instructions given to you by our office regarding your preparation for the procedure.  Thank you,  Dr. Jackquline Denmark

## 2019-07-09 NOTE — Progress Notes (Signed)
Chief Complaint: Epigastric pain  Referring Provider:  Greig Right, MD      ASSESSMENT AND PLAN;   #1. Epi pain with longstanding GERD. S/P lap chole in the past. Nl CBC, CMP and lipase as per notes  #2. Diarrhea lilely post chole diarrhea/associated IBS. R/O other causes.    Plan: - Change omeprazole to Protonix 40mg  po qd #30, 6 refills - EGD for further evaluation with possible  Eso/SB Bx. - Nonpharmacologic means of reflux control was stressed. - Continue levsin S/L  for now.  If still with diarrhea, will give her a trial of cholestyramine.  Would like to hold off on colonoscopy as she is not having any red flag symptoms and I believe yield will be low at the present time. - We will obtain previous blood work from Dr [employer] office.   HPI:    [name] is a 34 y.o. 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  Was more prominent when she was pregnant 10 years ago.  Has been on omeprazole with some relief initially.  Denies having any significant odynophagia or dysphagia.  At times she does have "esophageal spasms" and had chest pains which are deemed to be noncardiac in the past.  Has been having intermittent epigastric/right upper quadrant/left upper quadrant abdominal pain x 6 months, getting somewhat worse.  More so after eating fried foods.  Denies having any history suggestive of lactose or gluten intolerance.  Diarrhea after GB Sx 04/2018.  Used to have normal bowel movements before.  Diarrhea the frequency of 1-2 bowel movements per day, loose, more with fatty foods.  No melena or hematochezia.  No recent weight loss.  No exposure to anyone with COVID-19.  No sodas, chocolates, chewing gums, artificial sweeteners and candy. No NSAIDs now.  She used to take some ibuprofen.  No nausea/vomiting.   SH-married, 2 children 10 and 5, both  C-sections.  Past Medical History:  Diagnosis Date  . Anxiety    situational  . 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 Complication of anesthesia    took a long to get feeling back in legs after anesthesia with both c- sections  . Gallstones    symptomatic  . GERD (gastroesophageal reflux disease)   . H/O hidradenitis suppurativa   . Headache    migraine  . Obesity   . Pneumonia   . PONV (postoperative nausea and vomiting)   . UTI (urinary tract infection)     Past Surgical History:  Procedure Laterality Date  . CESAREAN SECTION  2010  . CESAREAN SECTION N/A 12/17/2013   Procedure: CESAREAN SECTION;  Surgeon: Melina Schools, MD;  Location: [facility];  Service: Obstetrics;  Laterality: N/A;  1 1/2hrs OR time  . CHOLECYSTECTOMY N/A 04/09/2018   Procedure: LAPAROSCOPIC CHOLECYSTECTOMY;  Surgeon: Coralie Keens, MD;  Location: [facility];  Service: General;  Laterality: N/A;  . TONSILLECTOMY      Family History  Problem Relation Age of Onset  . Hypertension Mother   . Irritable bowel syndrome Mother   . Hypertension Father   . Diabetes Father   . Colon cancer Neg Hx   . Esophageal cancer Neg Hx     Social History   Tobacco Use  . Smoking status: Never Smoker  . Smokeless tobacco: Never Used  Substance Use Topics  .  Alcohol use: Yes    Comment: rare  . Drug use: No    Current Outpatient Medications  Medication Sig Dispense Refill  . calcium carbonate (TUMS - DOSED IN MG ELEMENTAL CALCIUM) 500 MG chewable tablet Chew 1-2 tablets by mouth 2 (two) times daily as needed for indigestion or heartburn.     . hyoscyamine (LEVSIN) 0.125 MG tablet Take 0.125 mg by mouth as needed.    Marland Kitchen ibuprofen (ADVIL,MOTRIN) 200 MG tablet Take 600 mg by mouth daily as needed (migraines).    Theola Sequin Triphasic (NORTREL 7/7/7 PO) Take 1 tablet by mouth daily.    . sertraline (ZOLOFT) 100 MG tablet Take 100 mg by mouth daily.    . mupirocin ointment (BACTROBAN) 2 % Place 1  application into the nose as needed.    . Sulfamethoxazole-Trimethoprim (SULFAMETHOXAZOLE-TMP DS PO) Take 1 tablet by mouth 2 (two) times daily as needed.     No current facility-administered medications for this visit.    Allergies  Allergen Reactions  . Percocet [Oxycodone-Acetaminophen] Nausea And Vomiting    Review of Systems:  Constitutional: Denies fever, chills, diaphoresis, appetite change and fatigue.  HEENT: Denies photophobia, eye pain, redness, hearing loss, ear pain, congestion, sore throat, rhinorrhea, sneezing, mouth sores, neck pain, neck stiffness and tinnitus.   Respiratory: Denies SOB, DOE, cough, chest tightness,  and wheezing.   Cardiovascular: Denies chest pain, palpitations and leg swelling.  Genitourinary: Denies dysuria, urgency, frequency, hematuria, flank pain and difficulty urinating.  Musculoskeletal: Denies myalgias, back pain, joint swelling, arthralgias and gait problem.  Skin: No rash.  Neurological: Denies dizziness, seizures, syncope, weakness, light-headedness, numbness and headaches.  Hematological: Denies adenopathy. Easy bruising, personal or family bleeding history  Psychiatric/Behavioral: has anxiety or depression     Physical Exam:    BP 118/78   Pulse 98   Temp (!) 97.1 F (36.2 C)   Ht 5\' 8"  (1.727 m)   Wt 282 lb 6 oz (128.1 kg)   BMI 42.93 kg/m  Wt Readings from Last 3 Encounters:  07/09/19 282 lb 6 oz (128.1 kg)  04/09/18 286 lb (129.7 kg)  07/15/17 288 lb (130.6 kg)   Constitutional:  Well-developed, in no acute distress. Psychiatric: Normal mood and affect. Behavior is normal. HEENT: Pupils normal.  Conjunctivae are normal. No scleral icterus. Neck supple.  Cardiovascular: Normal rate, regular rhythm. No edema Pulmonary/chest: Effort normal and breath sounds normal. No wheezing, rales or rhonchi. Abdominal: Soft, nondistended. Nontender. Bowel sounds active throughout. There are no masses palpable. No hepatomegaly. Rectal:   defered Neurological: Alert and oriented to person place and time. Skin: Skin is warm and dry. No rashes noted.  Data Reviewed: I have personally reviewed following labs and imaging studies  CBC: CBC Latest Ref Rng & Units 04/09/2018 09/04/2015 12/18/2013  WBC 4.0 - 10.5 K/uL 9.8 9.5 12.0(H)  Hemoglobin 12.0 - 15.0 g/dL 13.2 13.4 10.7(L)  Hematocrit 36.0 - 46.0 % 42.6 39.8 31.5(L)  Platelets 150 - 400 K/uL 278 259 161    CMP: CMP Latest Ref Rng & Units 09/05/2015 09/04/2015 12/16/2013  Glucose 65 - 99 mg/dL - 119(H) 130(H)  BUN 6 - 20 mg/dL - 9 4(L)  Creatinine 0.44 - 1.00 mg/dL - 0.85 0.53  Sodium 135 - 145 mmol/L - 137 137  Potassium 3.5 - 5.1 mmol/L - 3.9 3.4(L)  Chloride 101 - 111 mmol/L - 105 101  CO2 22 - 32 mmol/L - 23 23  Calcium 8.9 - 10.3 mg/dL - 9.7  9.1  Total Protein 6.5 - 8.1 g/dL 7.6 - 5.6(L)  Total Bilirubin 0.3 - 1.2 mg/dL 0.5 - 0.3  Alkaline Phos 38 - 126 U/L 123 - 188(H)  AST 15 - 41 U/L 27 - 29  ALT 14 - 54 U/L 40 - 23      Radiology Studies: No results found.    Carmell Austria, MD 07/09/2019, 9:18 AM  Cc: Greig Right, MD

## 2019-07-10 ENCOUNTER — Ambulatory Visit (INDEPENDENT_AMBULATORY_CARE_PROVIDER_SITE_OTHER): Payer: 59

## 2019-07-10 DIAGNOSIS — Z1159 Encounter for screening for other viral diseases: Secondary | ICD-10-CM

## 2019-07-11 LAB — SARS CORONAVIRUS 2 (TAT 6-24 HRS): SARS Coronavirus 2: NEGATIVE

## 2019-07-14 ENCOUNTER — Ambulatory Visit (AMBULATORY_SURGERY_CENTER): Payer: 59 | Admitting: Gastroenterology

## 2019-07-14 ENCOUNTER — Other Ambulatory Visit: Payer: Self-pay

## 2019-07-14 ENCOUNTER — Encounter: Payer: Self-pay | Admitting: Gastroenterology

## 2019-07-14 VITALS — BP 170/86 | HR 69 | Temp 94.4°F | Resp 17 | Ht 68.0 in | Wt 282.0 lb

## 2019-07-14 DIAGNOSIS — K2950 Unspecified chronic gastritis without bleeding: Secondary | ICD-10-CM

## 2019-07-14 DIAGNOSIS — K3189 Other diseases of stomach and duodenum: Secondary | ICD-10-CM | POA: Diagnosis not present

## 2019-07-14 DIAGNOSIS — K317 Polyp of stomach and duodenum: Secondary | ICD-10-CM | POA: Diagnosis not present

## 2019-07-14 DIAGNOSIS — K219 Gastro-esophageal reflux disease without esophagitis: Secondary | ICD-10-CM | POA: Diagnosis not present

## 2019-07-14 DIAGNOSIS — R109 Unspecified abdominal pain: Secondary | ICD-10-CM

## 2019-07-14 MED ORDER — SODIUM CHLORIDE 0.9 % IV SOLN: 500.0000 mL | Freq: Once | INTRAVENOUS | Status: DC

## 2019-07-14 NOTE — Progress Notes (Signed)
Report given to PACU, vss 

## 2019-07-14 NOTE — Patient Instructions (Signed)
Await pathology results  NO ASPIRIN, ASPIRIN CONTAINING PRODUCTS (BC OR GOODY POWDERS) OR NSAIDS (IBUPROFEN, ADVIL, ALEVE, AND MOTRIN); TYLENOL IS OK TO TAKE   YOU HAD AN ENDOSCOPIC PROCEDURE TODAY AT THE [facility]:   Refer to the procedure report that was given to you for any specific questions about what was found during the examination.  If the procedure report does not answer your questions, please call your gastroenterologist to clarify.  If you requested that your care partner not be given the details of your procedure findings, then the procedure report has been included in a sealed envelope for you to review at your convenience later.  YOU SHOULD EXPECT: Some feelings of bloating in the abdomen. Passage of more gas than usual.  Walking can help get rid of the air that was put into your GI tract during the procedure and reduce the bloating. If you had a lower endoscopy (such as a colonoscopy or flexible sigmoidoscopy) you may notice spotting of blood in your stool or on the toilet paper. If you underwent a bowel prep for your procedure, you may not have a normal bowel movement for a few days.  Please Note:  You might notice some irritation and congestion in your nose or some drainage.  This is from the oxygen used during your procedure.  There is no need for concern and it should clear up in a day or so.  SYMPTOMS TO REPORT IMMEDIATELY:   Following upper endoscopy (EGD)  Vomiting of blood or coffee ground material  New chest pain or pain under the shoulder blades  Painful or persistently difficult swallowing  New shortness of breath  Fever of 100F or higher  Black, tarry-looking stools  For urgent or emergent issues, a gastroenterologist can be reached at any hour by calling [phone].   DIET:  We do recommend a small meal at first, but then you may proceed to your regular diet.  Drink plenty of fluids but you should avoid alcoholic beverages for 24  hours.  ACTIVITY:  You should plan to take it easy for the rest of today and you should NOT DRIVE or use heavy machinery until tomorrow (because of the sedation medicines used during the test).    FOLLOW UP: Our staff will call the number listed on your records 48-72 hours following your procedure to check on you and address any questions or concerns that you may have regarding the information given to you following your procedure. If we do not reach you, we will leave a message.  We will attempt to reach you two times.  During this call, we will ask if you have developed any symptoms of COVID 19. If you develop any symptoms (ie: fever, flu-like symptoms, shortness of breath, cough etc.) before then, please call [phone].  If you test positive for Covid 19 in the 2 weeks post procedure, please call and report this information to [place].    If any biopsies were taken you will be contacted by phone or by letter within the next 1-3 weeks.  Please call us at [phone] if you have not heard about the biopsies in 3 weeks.    SIGNATURES/CONFIDENTIALITY: You and/or your care partner have signed paperwork which will be entered into your electronic medical record.  These signatures attest to the fact that that the information above on your After Visit Summary has been reviewed and is understood.  Full responsibility of the confidentiality of this discharge information lies with  you and/or your care-partner.

## 2019-07-14 NOTE — Progress Notes (Signed)
Temp by JB and vitals by DT 

## 2019-07-14 NOTE — Op Note (Signed)
[place] Patient Name: [name] Procedure Date: 07/14/2019 2:57 PM MRN: [id number] Endoscopist: Jackquline Denmark , MD Age: 34 Referring MD:  Date of Birth: 1986/01/13 Gender: Female Account #: [id number] Procedure:                Upper GI endoscopy Indications:              Epigastric abdominal pain, GERD. Medicines:                Monitored Anesthesia Care Procedure:                Pre-Anesthesia Assessment:                           - Prior to the procedure, a History and Physical                            was performed, and patient medications and                            allergies were reviewed. The patient's tolerance of                            previous anesthesia was also reviewed. The risks                            and benefits of the procedure and the sedation                            options and risks were discussed with the patient.                            All questions were answered, and informed consent                            was obtained. Prior Anticoagulants: The patient has                            taken no previous anticoagulant or antiplatelet                            agents. ASA Grade Assessment: II - A patient with                            mild systemic disease. After reviewing the risks                            and benefits, the patient was deemed in                            satisfactory condition to undergo the procedure.                           After obtaining informed consent, the endoscope was  passed under direct vision. Throughout the                            procedure, the patient's blood pressure, pulse, and                            oxygen saturations were monitored continuously. The                            Endoscope was introduced through the mouth, and                            advanced to the second part of duodenum. The upper                            GI endoscopy was  accomplished without difficulty.                            The patient tolerated the procedure well. Scope In: Scope Out: Findings:                 The examined esophagus was normal. Z-line                            well-defined at 35 cm.                           Localized moderate inflammation characterized by                            erythema was found in the gastric antrum. Biopsies                            were taken with a cold forceps for histology.                           8-10, 6 to 8 mm sessile polyps with no bleeding and                            no stigmata of recent bleeding were found in the                            gastric fundus and in the gastric body. 3 of the                            larger polyps were removed with a hot snare.                            Resection and retrieval were complete.                           The examined duodenum was normal. Biopsies for  histology were taken with a cold forceps for                            evaluation of celiac disease. Complications:            No immediate complications. Estimated Blood Loss:     Estimated blood loss: none. Impression:               - Moderate gastritis.                           - A few gastric polyps. (Resected and retrieved x                            3).                           - Normal examined duodenum. Biopsied. Recommendation:           - Patient has a contact number available for                            emergencies. The signs and symptoms of potential                            delayed complications were discussed with the                            patient. Return to normal activities tomorrow.                            Written discharge instructions were provided to the                            patient.                           - Resume previous diet.                           - Continue present medications.                           - Await  pathology results.                           - No aspirin, ibuprofen, naproxen, or other                            non-steroidal anti-inflammatory drugs for 5 days                            after polyp removal.                           - Return to GI clinic in 12 weeks.                           -  The findings and recommendations were discussed                            with the patient's husband Tim. Jackquline Denmark, MD 07/14/2019 3:26:15 PM This report has been signed electronically.

## 2019-07-14 NOTE — Progress Notes (Signed)
Called to room to assist during endoscopic procedure.  Patient ID and intended procedure confirmed with present staff. Received instructions for my participation in the procedure from the performing physician.  

## 2019-07-16 ENCOUNTER — Telehealth: Payer: Self-pay | Admitting: Gastroenterology

## 2019-07-16 ENCOUNTER — Telehealth: Payer: Self-pay

## 2019-07-16 NOTE — Telephone Encounter (Signed)
Called and spoke with patient-patient informed of MD recommendations; patient is agreeable with plan of care and  Patient verbalized understanding of information/instructions;  Patient was advised to call the office at [phone] if questions/concerns arise or if symptoms persist past 3-4 days; Non-pharmacological measures of acid reduction/acid reflux management also mentioned to the patient;

## 2019-07-16 NOTE — Telephone Encounter (Signed)
Called and spoke with patient-patient reports the burning and uncomfortable has subsided (still uncomfortable) after taking Tums and drinking water; sensation lasted about 2 hours; also took a Levsin and had "some relief";  Took Protonix this am for morning med; patient has not eaten anything to cause "burning sensation"; denies an increase in stress/denies diet changes or any cause for this episode; patient reports "it was a tight pressure like a spasm and it got worse with drinking water to try to "calm things done";   Please advise

## 2019-07-16 NOTE — Telephone Encounter (Signed)
Likely related to EGD on 07/14/2019 To some extent is expected as well. EGD went very well.  Plan: -Can use Mylanta or Maalox 15 to 30 cc p.o. every 6-8 hours as needed. -Can take Protonix twice a day for 3 days, then can switch to once a day -Let us know if she is not better in 3 to 4 days  RG

## 2019-07-16 NOTE — Telephone Encounter (Signed)
  Follow up Call-  Call back number 07/14/2019  Post procedure Call Back phone  # [phone]  Permission to leave phone message Yes  Some recent data might be hidden     Patient questions:  Do you have a fever, pain , or abdominal swelling? No. Pain Score  0 *  Have you tolerated food without any problems? Yes.    Have you been able to return to your normal activities? Yes.    Do you have any questions about your discharge instructions: Diet   No. Medications  No. Follow up visit  No.  Do you have questions or concerns about your Care? No.  Actions: * If pain score is 4 or above: No action needed, pain <4.   1. Have you developed a fever since your procedure? No  2.   Have you had an respiratory symptoms (SOB or cough) since your procedure? No  3.   Have you tested positive for COVID 19 since your procedure? No  4.   Have you had any family members/close contacts diagnosed with the COVID 19 since your procedure?  No   If yes to any of these questions please route to Joylene John, RN and Alphonsa Gin, RN.

## 2019-07-16 NOTE — Telephone Encounter (Signed)
Left message on follow up call. 

## 2019-07-17 ENCOUNTER — Encounter: Payer: Self-pay | Admitting: Gastroenterology

## 2020-02-10 ENCOUNTER — Other Ambulatory Visit: Payer: Self-pay | Admitting: Gastroenterology

## 2020-10-20 ENCOUNTER — Other Ambulatory Visit: Payer: Self-pay | Admitting: Gastroenterology

## 2021-03-07 ENCOUNTER — Other Ambulatory Visit: Payer: Self-pay | Admitting: Gastroenterology

## 2022-03-08 LAB — OB RESULTS CONSOLE ANTIBODY SCREEN: Antibody Screen: NEGATIVE

## 2022-03-08 LAB — OB RESULTS CONSOLE HIV ANTIBODY (ROUTINE TESTING): HIV: NONREACTIVE

## 2022-03-08 LAB — OB RESULTS CONSOLE ABO/RH: RH Type: POSITIVE

## 2022-03-08 LAB — OB RESULTS CONSOLE HEPATITIS B SURFACE ANTIGEN: Hepatitis B Surface Ag: NEGATIVE

## 2022-03-08 LAB — OB RESULTS CONSOLE RUBELLA ANTIBODY, IGM: Rubella: IMMUNE

## 2022-03-08 LAB — OB RESULTS CONSOLE GC/CHLAMYDIA
Chlamydia: NEGATIVE
Neisseria Gonorrhea: NEGATIVE

## 2022-03-08 LAB — OB RESULTS CONSOLE RPR: RPR: NONREACTIVE

## 2022-03-08 LAB — HEPATITIS C ANTIBODY: HCV Ab: NEGATIVE

## 2022-03-09 ENCOUNTER — Other Ambulatory Visit: Payer: Self-pay

## 2022-04-06 ENCOUNTER — Other Ambulatory Visit: Payer: Self-pay | Admitting: Obstetrics and Gynecology

## 2022-04-06 DIAGNOSIS — Z98891 History of uterine scar from previous surgery: Secondary | ICD-10-CM

## 2022-04-06 DIAGNOSIS — Z3A19 19 weeks gestation of pregnancy: Secondary | ICD-10-CM

## 2022-04-06 DIAGNOSIS — O4442 Low lying placenta NOS or without hemorrhage, second trimester: Secondary | ICD-10-CM

## 2022-04-06 DIAGNOSIS — Z363 Encounter for antenatal screening for malformations: Secondary | ICD-10-CM

## 2022-04-06 DIAGNOSIS — O09522 Supervision of elderly multigravida, second trimester: Secondary | ICD-10-CM

## 2022-04-06 DIAGNOSIS — Z6841 Body Mass Index (BMI) 40.0 and over, adult: Secondary | ICD-10-CM

## 2022-05-17 ENCOUNTER — Encounter: Payer: Self-pay | Admitting: *Deleted

## 2022-05-22 ENCOUNTER — Encounter: Payer: Self-pay | Admitting: *Deleted

## 2022-05-22 ENCOUNTER — Ambulatory Visit: Payer: 59 | Attending: Obstetrics and Gynecology

## 2022-05-22 ENCOUNTER — Ambulatory Visit: Payer: 59 | Admitting: *Deleted

## 2022-05-22 VITALS — BP 126/76 | HR 93

## 2022-05-22 DIAGNOSIS — Z3A19 19 weeks gestation of pregnancy: Secondary | ICD-10-CM | POA: Diagnosis present

## 2022-05-22 DIAGNOSIS — O4442 Low lying placenta NOS or without hemorrhage, second trimester: Secondary | ICD-10-CM | POA: Insufficient documentation

## 2022-05-22 DIAGNOSIS — Z3689 Encounter for other specified antenatal screening: Secondary | ICD-10-CM | POA: Diagnosis present

## 2022-05-22 DIAGNOSIS — Z98891 History of uterine scar from previous surgery: Secondary | ICD-10-CM | POA: Diagnosis present

## 2022-05-22 DIAGNOSIS — Z6841 Body Mass Index (BMI) 40.0 and over, adult: Secondary | ICD-10-CM | POA: Diagnosis present

## 2022-05-22 DIAGNOSIS — O09522 Supervision of elderly multigravida, second trimester: Secondary | ICD-10-CM | POA: Diagnosis present

## 2022-05-22 DIAGNOSIS — Z363 Encounter for antenatal screening for malformations: Secondary | ICD-10-CM | POA: Diagnosis present

## 2022-05-23 ENCOUNTER — Other Ambulatory Visit: Payer: Self-pay | Admitting: *Deleted

## 2022-05-23 DIAGNOSIS — Z362 Encounter for other antenatal screening follow-up: Secondary | ICD-10-CM

## 2022-05-23 DIAGNOSIS — O34219 Maternal care for unspecified type scar from previous cesarean delivery: Secondary | ICD-10-CM

## 2022-05-23 DIAGNOSIS — O09522 Supervision of elderly multigravida, second trimester: Secondary | ICD-10-CM

## 2022-05-23 DIAGNOSIS — O99212 Obesity complicating pregnancy, second trimester: Secondary | ICD-10-CM

## 2022-06-22 ENCOUNTER — Ambulatory Visit: Payer: 59

## 2022-06-22 ENCOUNTER — Ambulatory Visit: Payer: 59 | Attending: Obstetrics and Gynecology

## 2022-06-22 VITALS — BP 139/70 | HR 94

## 2022-06-22 DIAGNOSIS — Z362 Encounter for other antenatal screening follow-up: Secondary | ICD-10-CM | POA: Diagnosis present

## 2022-06-22 DIAGNOSIS — O99212 Obesity complicating pregnancy, second trimester: Secondary | ICD-10-CM

## 2022-06-22 DIAGNOSIS — O3662X0 Maternal care for excessive fetal growth, second trimester, not applicable or unspecified: Secondary | ICD-10-CM | POA: Diagnosis not present

## 2022-06-22 DIAGNOSIS — O09522 Supervision of elderly multigravida, second trimester: Secondary | ICD-10-CM | POA: Diagnosis not present

## 2022-06-22 DIAGNOSIS — O34219 Maternal care for unspecified type scar from previous cesarean delivery: Secondary | ICD-10-CM

## 2022-06-22 DIAGNOSIS — E669 Obesity, unspecified: Secondary | ICD-10-CM

## 2022-06-22 DIAGNOSIS — Z3A23 23 weeks gestation of pregnancy: Secondary | ICD-10-CM

## 2022-07-19 ENCOUNTER — Encounter: Payer: 59 | Attending: Obstetrics and Gynecology | Admitting: Registered"

## 2022-07-19 ENCOUNTER — Encounter: Payer: Self-pay | Admitting: Registered"

## 2022-07-19 DIAGNOSIS — O24419 Gestational diabetes mellitus in pregnancy, unspecified control: Secondary | ICD-10-CM | POA: Insufficient documentation

## 2022-07-19 NOTE — Progress Notes (Signed)
The following learning objectives were met by the patient during this course:   States the definition of Gestational Diabetes States why dietary management is important in controlling blood glucose Describes the effects each nutrient has on blood glucose levels Demonstrates ability to create a balanced meal plan Demonstrates carbohydrate counting  States when to check blood glucose levels Demonstrates proper blood glucose monitoring techniques States the effect of stress and exercise on blood glucose levels States the importance of limiting caffeine and abstaining from alcohol and smoking   Blood glucose monitor given: Accu-chek Guide Me Lot [id number] Exp: 2023-04-17 CBG: 72 mg/dL .   Patient instructed to monitor glucose levels: FBS: 60 - <95; 1 hour: <140; 2 hour: <120   Patient received handouts: Nutrition Diabetes and Pregnancy, including carb counting list Glucose log sheet   Patient will be seen for follow-up as needed.

## 2022-08-03 ENCOUNTER — Other Ambulatory Visit: Payer: Self-pay | Admitting: Obstetrics and Gynecology

## 2022-08-03 DIAGNOSIS — O34211 Maternal care for low transverse scar from previous cesarean delivery: Secondary | ICD-10-CM

## 2022-08-17 ENCOUNTER — Inpatient Hospital Stay (HOSPITAL_COMMUNITY)
Admission: AD | Admit: 2022-08-17 | Discharge: 2022-08-18 | Disposition: A | Discharge status: Home / Self Care | Payer: 59 | Attending: Obstetrics and Gynecology | Admitting: Obstetrics and Gynecology

## 2022-08-17 ENCOUNTER — Encounter (HOSPITAL_COMMUNITY): Payer: Self-pay | Admitting: Obstetrics and Gynecology

## 2022-08-17 DIAGNOSIS — Z79899 Other long term (current) drug therapy: Secondary | ICD-10-CM | POA: Diagnosis not present

## 2022-08-17 DIAGNOSIS — Z3A31 31 weeks gestation of pregnancy: Secondary | ICD-10-CM | POA: Insufficient documentation

## 2022-08-17 DIAGNOSIS — R109 Unspecified abdominal pain: Secondary | ICD-10-CM | POA: Insufficient documentation

## 2022-08-17 DIAGNOSIS — O24419 Gestational diabetes mellitus in pregnancy, unspecified control: Secondary | ICD-10-CM | POA: Diagnosis not present

## 2022-08-17 DIAGNOSIS — R519 Headache, unspecified: Secondary | ICD-10-CM | POA: Insufficient documentation

## 2022-08-17 DIAGNOSIS — Z7982 Long term (current) use of aspirin: Secondary | ICD-10-CM | POA: Insufficient documentation

## 2022-08-17 DIAGNOSIS — O133 Gestational [pregnancy-induced] hypertension without significant proteinuria, third trimester: Secondary | ICD-10-CM | POA: Diagnosis not present

## 2022-08-17 DIAGNOSIS — O4700 False labor before 37 completed weeks of gestation, unspecified trimester: Secondary | ICD-10-CM | POA: Diagnosis not present

## 2022-08-17 DIAGNOSIS — O09523 Supervision of elderly multigravida, third trimester: Secondary | ICD-10-CM | POA: Insufficient documentation

## 2022-08-17 DIAGNOSIS — O26893 Other specified pregnancy related conditions, third trimester: Secondary | ICD-10-CM | POA: Insufficient documentation

## 2022-08-17 LAB — COMPREHENSIVE METABOLIC PANEL
ALT: 41 U/L (ref 0–44)
AST: 37 U/L (ref 15–41)
Albumin: 2.5 g/dL — ABNORMAL LOW (ref 3.5–5.0)
Alkaline Phosphatase: 151 U/L — ABNORMAL HIGH (ref 38–126)
Anion gap: 11 (ref 5–15)
BUN: 5 mg/dL — ABNORMAL LOW (ref 6–20)
CO2: 23 mmol/L (ref 22–32)
Calcium: 9.3 mg/dL (ref 8.9–10.3)
Chloride: 100 mmol/L (ref 98–111)
Creatinine, Ser: 0.59 mg/dL (ref 0.44–1.00)
GFR, Estimated: >60 mL/min (ref >60)
Glucose, Bld: 109 mg/dL — ABNORMAL HIGH (ref 70–99)
Potassium: 3.7 mmol/L (ref 3.5–5.1)
Sodium: 134 mmol/L — ABNORMAL LOW (ref 135–145)
Total Bilirubin: 0.5 mg/dL (ref 0.3–1.2)
Total Protein: 5.7 g/dL — ABNORMAL LOW (ref 6.5–8.1)

## 2022-08-17 LAB — URINALYSIS, ROUTINE W REFLEX MICROSCOPIC
Bilirubin Urine: NEGATIVE
Glucose, UA: NEGATIVE mg/dL
Hgb urine dipstick: NEGATIVE
Ketones, ur: 20 mg/dL — AB
Nitrite: NEGATIVE
Protein, ur: 30 mg/dL — AB
Specific Gravity, Urine: 1.015 (ref 1.005–1.030)
pH: 6 (ref 5.0–8.0)

## 2022-08-17 LAB — CBC
HCT: 28.8 % — ABNORMAL LOW (ref 36.0–46.0)
Hemoglobin: 9.5 g/dL — ABNORMAL LOW (ref 12.0–15.0)
MCH: 27 pg (ref 26.0–34.0)
MCHC: 33 g/dL (ref 30.0–36.0)
MCV: 81.8 fL (ref 80.0–100.0)
Platelets: 248 10*3/uL (ref 150–400)
RBC: 3.52 MIL/uL — ABNORMAL LOW (ref 3.87–5.11)
RDW: 13.4 % (ref 11.5–15.5)
WBC: 12.3 10*3/uL — ABNORMAL HIGH (ref 4.0–10.5)
nRBC: 0 % (ref 0.0–0.2)

## 2022-08-17 LAB — PROTEIN / CREATININE RATIO, URINE
Creatinine, Urine: 126 mg/dL
Protein Creatinine Ratio: 0.26 mg/mg{Cre} — ABNORMAL HIGH (ref 0.00–0.15)
Total Protein, Urine: 33 mg/dL

## 2022-08-17 MED ORDER — CALCIUM CARBONATE ANTACID 500 MG PO CHEW
400.0000 mg | CHEWABLE_TABLET | Freq: Once | ORAL | Status: AC
  Administered 2022-08-17: 400 mg via ORAL
  Filled 2022-08-17: qty 2

## 2022-08-17 MED ORDER — ACETAMINOPHEN-CAFFEINE 500-65 MG PO TABS
2.0000 | ORAL_TABLET | Freq: Once | ORAL | Status: AC
  Administered 2022-08-17: 2 via ORAL
  Filled 2022-08-17: qty 2

## 2022-08-17 MED ORDER — NIFEDIPINE 10 MG PO CAPS
10.0000 mg | ORAL_CAPSULE | ORAL | Status: AC | PRN
  Administered 2022-08-17 (×4): 10 mg via ORAL
  Filled 2022-08-17 (×5): qty 1

## 2022-08-17 MED ORDER — SODIUM CHLORIDE 0.9 % IV SOLN
2.0000 g | Freq: Once | INTRAVENOUS | Status: AC
  Administered 2022-08-18: 2 g via INTRAVENOUS
  Filled 2022-08-17: qty 20

## 2022-08-17 MED ORDER — LACTATED RINGERS IV SOLN: Freq: Once | INTRAVENOUS | Status: AC

## 2022-08-17 NOTE — MAU Provider Note (Signed)
Chief Complaint:  Contractions and Headache   Event Date/Time   First Provider Initiated Contact with Patient 08/17/22 2120     HPI: [name] is a 37 y.o. G3P2002 at 31w5dwho presents to maternity admissions reporting preterm uterine contractions.  Started a few hours ago. Also has a headache, has not taken anything for it. No history of preterm labor.  Pregnancy has been unremarkable except for Gestational Diabetes.. She reports good fetal movement, denies LOF, vaginal bleeding, vaginal itching/burning, urinary symptoms, h/a, dizziness, n/v, diarrhea, constipation or fever/chills.  She denies visual changes or RUQ abdominal pain.  Abdominal Pain This is a new problem. The current episode started today. The onset quality is gradual. The problem occurs intermittently. The problem has been unchanged. The quality of the pain is cramping. Nothing aggravates the pain. The pain is relieved by Nothing.   RN Note: .[name] is a 37 y.o. at [redacted]w[redacted]d here in MAU reporting ctxs since 1800. Also has a headache which started 3mins ago. Denies LOF or VB. Good FM. Denies visual changes or epigastric pain   Past Medical History: Past Medical History:  Diagnosis Date   Anemia    with pregnancy   Anxiety    situational   Asthma    fitness induced Asthma, no inhaler"   Complication of anesthesia    took a long to get feeling back in legs after anesthesia with both c- sections   Esophageal spasm    Gallstones    symptomatic   GERD (gastroesophageal reflux disease)    H/O hidradenitis suppurativa    Headache    migraine   Obesity    Pneumonia    PONV (postoperative nausea and vomiting)    UTI (urinary tract infection)    Vaginal Pap smear, abnormal     Past obstetric history: OB History  Gravida Para Term Preterm AB Living  3 2 2     2   SAB IAB Ectopic Multiple Live Births          2    # Outcome Date GA Lbr Len/2nd Weight Sex Delivery Anes PTL Lv  3 Current           2 Term  12/17/13 [redacted]w[redacted]d  4250 g M CS-LTranv Spinal  LIV  1 Term         LIV    Past Surgical History: Past Surgical History:  Procedure Laterality Date   CESAREAN SECTION  2010   CESAREAN SECTION N/A 12/17/2013   Procedure: CESAREAN SECTION;  Surgeon: Melina Schools, MD;  Location: [facility];  Service: Obstetrics;  Laterality: N/A;  1 1/2hrs OR time   CHOLECYSTECTOMY N/A 04/09/2018   Procedure: LAPAROSCOPIC CHOLECYSTECTOMY;  Surgeon: Coralie Keens, MD;  Location: [facility];  Service: General;  Laterality: N/A;   TONSILLECTOMY      Family History: Family History  Problem Relation Age of Onset   Hypertension Mother    Irritable bowel syndrome Mother    Hypertension Father    Diabetes Father    Stroke Maternal Aunt    Colon cancer Neg Hx    Esophageal cancer Neg Hx    Asthma Neg Hx    Heart disease Neg Hx     Social History: Social History   Tobacco Use   Smoking status: Never   Smokeless tobacco: Never  Vaping Use   Vaping Use: Never used  Substance Use Topics   Alcohol use: Yes    Comment: rare   Drug use: No  Allergies:  Allergies  Allergen Reactions   Percocet [Oxycodone-Acetaminophen] Nausea And Vomiting    Meds:  Medications Prior to Admission  Medication Sig Dispense Refill Last Dose   aspirin EC 81 MG tablet Take 81 mg by mouth daily. Swallow whole.      calcium carbonate (TUMS - DOSED IN MG ELEMENTAL CALCIUM) 500 MG chewable tablet Chew 1-2 tablets by mouth 2 (two) times daily as needed for indigestion or heartburn.       hyoscyamine (LEVSIN) 0.125 MG tablet Take 0.125 mg by mouth as needed. (Patient not taking: Reported on 05/22/2022)      omeprazole (PRILOSEC) 20 MG capsule       Prenatal Vit-Fe Fumarate-FA (PRENATAL MULTIVITAMIN) TABS tablet Take 1 tablet by mouth daily at 12 noon.      sertraline (ZOLOFT) 100 MG tablet Take 100 mg by mouth daily.       I have reviewed patient's Past Medical Hx, Surgical Hx, Family Hx, Social Hx, medications and allergies.    ROS:  Review of Systems Other systems negative  Physical Exam  Patient Vitals for the past 24 hrs:  BP Temp Pulse Resp SpO2 Height Weight  08/17/22 2110 (!) 148/88 -- (!) 104 -- 99 % -- --  08/17/22 2047 (!) 153/91 -- -- -- -- -- --  08/17/22 2046 -- 98.1 F (36.7 C) 97 18 100 % 5\' 8"  (1.727 m) (!) 147 kg   Vitals:   08/17/22 2310 08/17/22 2315 08/17/22 2330 08/18/22 0157  BP: (!) 142/68 (!) 128/57 132/61 131/67  Pulse: (!) 118 (!) 117 (!) 112 97  Resp:    18  Temp:      SpO2: 96% 98% 98% 99%  Weight:      Height:        Constitutional: Well-developed, well-nourished female in no acute distress.  Cardiovascular: normal rate  Respiratory: normal effort GI: Abd soft, non-tender, gravid appropriate for gestational age.   No rebound or guarding. MS: Extremities nontender, Trace edema, normal ROM Neurologic: Alert and oriented x 4. DTRs 2+/no clonus GU: Neg CVAT.  PELVIC EXAM: Dilation: Closed Effacement (%): 0 Cervical Position: Posterior Station: Ballotable Presentation: Undeterminable Exam by:: Jimmye Norman CNM   FHT:  Baseline 140 , moderate variability, accelerations present, no decelerations Contractions: q 2 mins Irregular    Labs: Results for orders placed or performed during the hospital encounter of 08/17/22 (from the past 24 hour(s))  Urinalysis, Routine w reflex microscopic -Urine, Clean Catch     Status: Abnormal   Collection Time: 08/17/22  8:57 PM  Result Value Ref Range   Color, Urine YELLOW YELLOW   APPearance CLOUDY (A) CLEAR   Specific Gravity, Urine 1.015 1.005 - 1.030   pH 6.0 5.0 - 8.0   Glucose, UA NEGATIVE NEGATIVE mg/dL   Hgb urine dipstick NEGATIVE NEGATIVE   Bilirubin Urine NEGATIVE NEGATIVE   Ketones, ur 20 (A) NEGATIVE mg/dL   Protein, ur 30 (A) NEGATIVE mg/dL   Nitrite NEGATIVE NEGATIVE   Leukocytes,Ua TRACE (A) NEGATIVE   RBC / HPF 6-10 0 - 5 RBC/hpf   WBC, UA 6-10 0 - 5 WBC/hpf   Bacteria, UA MANY (A) NONE SEEN   Squamous  Epithelial / HPF 6-10 0 - 5 /HPF   Mucus PRESENT   Protein / creatinine ratio, urine     Status: Abnormal   Collection Time: 08/17/22  8:57 PM  Result Value Ref Range   Creatinine, Urine 126 mg/dL   Total Protein, Urine 33 mg/dL  Protein Creatinine Ratio 0.26 (H) 0.00 - 0.15 mg/mg[Cre]  CBC     Status: Abnormal   Collection Time: 08/17/22  9:54 PM  Result Value Ref Range   WBC 12.3 (H) 4.0 - 10.5 K/uL   RBC 3.52 (L) 3.87 - 5.11 MIL/uL   Hemoglobin 9.5 (L) 12.0 - 15.0 g/dL   HCT 28.8 (L) 36.0 - 46.0 %   MCV 81.8 80.0 - 100.0 fL   MCH 27.0 26.0 - 34.0 pg   MCHC 33.0 30.0 - 36.0 g/dL   RDW 13.4 11.5 - 15.5 %   Platelets 248 150 - 400 K/uL   nRBC 0.0 0.0 - 0.2 %  Comprehensive metabolic panel     Status: Abnormal   Collection Time: 08/17/22  9:54 PM  Result Value Ref Range   Sodium 134 (L) 135 - 145 mmol/L   Potassium 3.7 3.5 - 5.1 mmol/L   Chloride 100 98 - 111 mmol/L   CO2 23 22 - 32 mmol/L   Glucose, Bld 109 (H) 70 - 99 mg/dL   BUN 5 (L) 6 - 20 mg/dL   Creatinine, Ser 0.59 0.44 - 1.00 mg/dL   Calcium 9.3 8.9 - 10.3 mg/dL   Total Protein 5.7 (L) 6.5 - 8.1 g/dL   Albumin 2.5 (L) 3.5 - 5.0 g/dL   AST 37 15 - 41 U/L   ALT 41 0 - 44 U/L   Alkaline Phosphatase 151 (H) 38 - 126 U/L   Total Bilirubin 0.5 0.3 - 1.2 mg/dL   GFR, Estimated >60 >60 mL/min   Anion gap 11 5 - 15   Unable to do Fetal Fibronectin due to recent IC    Imaging:  No results found.  MAU Course/MDM: I have reviewed the triage vital signs and the nursing notes.   Pertinent labs & imaging results that were available during my care of the patient were reviewed by me and considered in my medical decision making (see chart for details).      I have reviewed her medical records including past results, notes and treatments.   I have ordered labs and reviewed results. Preeclampsia labs are normal  NST reviewed Consult Dr Elly Modena with presentation, exam findings and test results.  Treatments in MAU included  Procardia Series After 3 doses, was contracting less but still feeling them So 4th dose given. Per Dr Elly Modena, she recommends starting patient on Labetalol 100mg  bid for now.  Cervix rechecked after 4th dose of Procardia:  .Cervix still posterior and difficult to reach but softer Consulted Dr Harrington Challenger:  will try IV hydration and a dose of Rocephin for presumed UTI (urine culture pending)   After this the contractions spaced out and were milder. Discussed with Dr Harrington Challenger who agrees with starting antihypertensive, preterm labor precautions.  Close followup in office  Assessment: Single IUP at [redacted]w[redacted]d Preterm uterine contractions without cervical change New Gestational Hypertension  Plan: Discharge home Preterm Labor precautions and fetal kick counts Pelvic rest Preeclampsia precautions Rx Labetalol 100mg  bid  Follow up in Office for prenatal visits and recheck Encouraged to return if she develops worsening of symptoms, increase in pain, fever, or other concerning symptoms.   Pt stable at time of discharge.  Hansel Feinstein CNM, MSN Certified Nurse-Midwife 08/17/2022 9:20 PM

## 2022-08-17 NOTE — MAU Note (Signed)
.  [name] is a 37 y.o. at [redacted]w[redacted]d here in MAU reporting ctxs since 1800. Also has a headache which started 2mins ago. Denies LOF or VB. Good FM. Denies visual changes or epigastric pain  Onset of complaint: 1800 Pain score: 3 for ctxs and 5 for h/a. No meds Vitals:   08/17/22 2046 08/17/22 2047  BP:  (!) 153/91  Pulse: 97   Resp: 18   Temp: 98.1 F (36.7 C)   SpO2: 100%      FHT:158 Lab orders placed from triage:  u/a

## 2022-08-18 DIAGNOSIS — O133 Gestational [pregnancy-induced] hypertension without significant proteinuria, third trimester: Secondary | ICD-10-CM

## 2022-08-18 DIAGNOSIS — O4700 False labor before 37 completed weeks of gestation, unspecified trimester: Secondary | ICD-10-CM

## 2022-08-18 DIAGNOSIS — Z3A31 31 weeks gestation of pregnancy: Secondary | ICD-10-CM

## 2022-08-18 MED ORDER — LABETALOL HCL 100 MG PO TABS: 100.0000 mg | ORAL_TABLET | Freq: Two times a day (BID) | ORAL | 0 refills | Status: DC

## 2022-08-18 MED ORDER — CEFADROXIL 500 MG PO CAPS: 500.0000 mg | ORAL_CAPSULE | Freq: Two times a day (BID) | ORAL | 0 refills | Status: AC

## 2022-08-18 MED ORDER — SODIUM CHLORIDE 0.9 % IV SOLN: Freq: Once | INTRAVENOUS | Status: AC

## 2022-08-19 LAB — CULTURE, OB URINE: Culture: 30000 — AB

## 2022-08-25 ENCOUNTER — Inpatient Hospital Stay (HOSPITAL_COMMUNITY)
Admission: AD | Admit: 2022-08-25 | Discharge: 2022-08-25 | Disposition: A | Discharge status: Home / Self Care | Payer: 59 | Attending: Obstetrics and Gynecology | Admitting: Obstetrics and Gynecology

## 2022-08-25 ENCOUNTER — Encounter (HOSPITAL_COMMUNITY): Payer: Self-pay | Admitting: Obstetrics and Gynecology

## 2022-08-25 DIAGNOSIS — Z3A32 32 weeks gestation of pregnancy: Secondary | ICD-10-CM | POA: Diagnosis not present

## 2022-08-25 DIAGNOSIS — O4703 False labor before 37 completed weeks of gestation, third trimester: Secondary | ICD-10-CM

## 2022-08-25 LAB — URINALYSIS, ROUTINE W REFLEX MICROSCOPIC
Bilirubin Urine: NEGATIVE
Glucose, UA: NEGATIVE mg/dL
Hgb urine dipstick: NEGATIVE
Ketones, ur: 5 mg/dL — AB
Leukocytes,Ua: NEGATIVE
Nitrite: NEGATIVE
Protein, ur: 30 mg/dL — AB
Specific Gravity, Urine: 1.017 (ref 1.005–1.030)
Squamous Epithelial / HPF: >50 /HPF (ref 0–5)
pH: 6 (ref 5.0–8.0)

## 2022-08-25 LAB — FETAL FIBRONECTIN: Fetal Fibronectin: NEGATIVE

## 2022-08-25 MED ORDER — NIFEDIPINE 10 MG PO CAPS: 10.0000 mg | ORAL_CAPSULE | ORAL | 0 refills | Status: DC | PRN

## 2022-08-25 MED ORDER — NIFEDIPINE 10 MG PO CAPS
10.0000 mg | ORAL_CAPSULE | ORAL | Status: AC | PRN
  Administered 2022-08-25 (×4): 10 mg via ORAL
  Filled 2022-08-25 (×4): qty 1

## 2022-08-25 NOTE — MAU Provider Note (Signed)
Chief Complaint:  Contractions   Event Date/Time   First Provider Initiated Contact with Patient 08/25/22 0252     HPI: [name] is a 37 y.o. G3P2002 at 62w6dwho presents to maternity admissions reporting preterm contractions since 2pm  Got stronger tonight. Was seen for this last week and treate with Procardia and fluids.  She reports good fetal movement, denies LOF, vaginal bleeding, vaginal itching/burning, urinary symptoms, h/a, dizziness, n/v, diarrhea, constipation or fever/chills.    Abdominal Pain This is a recurrent problem. The current episode started today. The problem occurs intermittently. The problem has been unchanged. The quality of the pain is cramping. Pertinent negatives include no constipation, diarrhea, dysuria, fever or frequency. Nothing aggravates the pain. The pain is relieved by Nothing. She has tried nothing for the symptoms.   RN Note: Contractions every: 5 minutes Onset of ctx: Today 2pm Pain score: 3/10              ROM: Intact Vaginal Bleeding: None Last SVE: closed on Thursday  Past Medical History: Past Medical History:  Diagnosis Date   Anemia    with pregnancy   Anxiety    situational   Asthma    fitness induced Asthma, no inhaler"   Complication of anesthesia    took a long to get feeling back in legs after anesthesia with both c- sections   Esophageal spasm    Gallstones    symptomatic   GERD (gastroesophageal reflux disease)    H/O hidradenitis suppurativa    Headache    migraine   Obesity    Pneumonia    PONV (postoperative nausea and vomiting)    UTI (urinary tract infection)    Vaginal Pap smear, abnormal     Past obstetric history: OB History  Gravida Para Term Preterm AB Living  3 2 2     2   SAB IAB Ectopic Multiple Live Births          2    # Outcome Date GA Lbr Len/2nd Weight Sex Delivery Anes PTL Lv  3 Current           2 Term 12/17/13 [redacted]w[redacted]d  4250 g M CS-LTranv Spinal  LIV  1 Term         LIV    Past  Surgical History: Past Surgical History:  Procedure Laterality Date   CESAREAN SECTION  2010   CESAREAN SECTION N/A 12/17/2013   Procedure: CESAREAN SECTION;  Surgeon: Melina Schools, MD;  Location: [facility];  Service: Obstetrics;  Laterality: N/A;  1 1/2hrs OR time   CHOLECYSTECTOMY N/A 04/09/2018   Procedure: LAPAROSCOPIC CHOLECYSTECTOMY;  Surgeon: Coralie Keens, MD;  Location: [facility];  Service: General;  Laterality: N/A;   TONSILLECTOMY      Family History: Family History  Problem Relation Age of Onset   Hypertension Mother    Irritable bowel syndrome Mother    Hypertension Father    Diabetes Father    Stroke Maternal Aunt    Colon cancer Neg Hx    Esophageal cancer Neg Hx    Asthma Neg Hx    Heart disease Neg Hx     Social History: Social History   Tobacco Use   Smoking status: Never   Smokeless tobacco: Never  Vaping Use   Vaping Use: Never used  Substance Use Topics   Alcohol use: Yes    Comment: rare   Drug use: No    Allergies:  Allergies  Allergen Reactions  Percocet [Oxycodone-Acetaminophen] Nausea And Vomiting    Meds:  Medications Prior to Admission  Medication Sig Dispense Refill Last Dose   aspirin EC 81 MG tablet Take 81 mg by mouth daily. Swallow whole.   08/24/2022   calcium carbonate (TUMS - DOSED IN MG ELEMENTAL CALCIUM) 500 MG chewable tablet Chew 1-2 tablets by mouth 2 (two) times daily as needed for indigestion or heartburn.    08/24/2022   cefadroxil (DURICEF) 500 MG capsule Take 1 capsule (500 mg total) by mouth every 12 (twelve) hours for 7 days. 14 capsule 0 08/24/2022   cetirizine (ZYRTEC) 5 MG tablet Take 5 mg by mouth daily.   08/24/2022   labetalol (NORMODYNE) 100 MG tablet Take 1 tablet (100 mg total) by mouth every 12 (twelve) hours. 60 tablet 0 08/24/2022   metFORMIN (GLUCOPHAGE) 500 MG tablet Take 500 mg by mouth 2 (two) times daily with a meal.   08/24/2022   omeprazole (PRILOSEC) 20 MG capsule    08/24/2022   Prenatal Vit-Fe  Fumarate-FA (PRENATAL MULTIVITAMIN) TABS tablet Take 1 tablet by mouth daily at 12 noon.   08/24/2022   sertraline (ZOLOFT) 100 MG tablet Take 100 mg by mouth daily.   08/24/2022   hyoscyamine (LEVSIN) 0.125 MG tablet Take 0.125 mg by mouth as needed. (Patient not taking: Reported on 05/22/2022)       I have reviewed patient's Past Medical Hx, Surgical Hx, Family Hx, Social Hx, medications and allergies.   ROS:  Review of Systems  Constitutional:  Negative for fever.  Gastrointestinal:  Positive for abdominal pain. Negative for constipation and diarrhea.  Genitourinary:  Negative for dysuria and frequency.   Other systems negative  Physical Exam  Patient Vitals for the past 24 hrs:  BP Temp Temp src Pulse Resp SpO2 Height Weight  08/25/22 0240 -- -- -- -- -- 99 % -- --  08/25/22 0239 135/77 97.9 F (36.6 C) Oral (!) 118 18 -- -- --  08/25/22 0224 -- -- -- -- -- -- 5\' 8"  (1.727 m) (!) 144.7 kg   Constitutional: Well-developed, well-nourished female in no acute distress.  Cardiovascular: normal rate Respiratory: normal effort GI: Abd soft, non-tender, gravid appropriate for gestational age.   No rebound or guarding. MS: Extremities nontender, no edema, normal ROM Neurologic: Alert and oriented x 4.  GU: Neg CVAT.  PELVIC EXAM:  Dilation: Closed Effacement (%): 0 Cervical Position: Posterior Station: Ballotable Presentation: Undeterminable Exam by:: Jimmye Norman CNM   FHT:  Baseline 145 , moderate variability, accelerations present, no decelerations Contractions: q 2-4 mins Irregular    Labs: Results for orders placed or performed during the hospital encounter of 08/25/22 (from the past 24 hour(s))  Fetal fibronectin     Status: None   Collection Time: 08/25/22  3:36 AM  Result Value Ref Range   Fetal Fibronectin NEGATIVE NEGATIVE  Urinalysis, Routine w reflex microscopic -Urine, Clean Catch     Status: Abnormal   Collection Time: 08/25/22  4:55 AM  Result Value Ref Range    Color, Urine YELLOW YELLOW   APPearance CLOUDY (A) CLEAR   Specific Gravity, Urine 1.017 1.005 - 1.030   pH 6.0 5.0 - 8.0   Glucose, UA NEGATIVE NEGATIVE mg/dL   Hgb urine dipstick NEGATIVE NEGATIVE   Bilirubin Urine NEGATIVE NEGATIVE   Ketones, ur 5 (A) NEGATIVE mg/dL   Protein, ur 30 (A) NEGATIVE mg/dL   Nitrite NEGATIVE NEGATIVE   Leukocytes,Ua NEGATIVE NEGATIVE   RBC / HPF 0-5 0 -  5 RBC/hpf   WBC, UA 6-10 0 - 5 WBC/hpf   Bacteria, UA MANY (A) NONE SEEN   Squamous Epithelial / HPF >50 0 - 5 /HPF   Mucus PRESENT    Ca Oxalate Crys, UA PRESENT      Imaging:  No results found.  MAU Course/MDM: I have reviewed the triage vital signs and the nursing notes.   Pertinent labs & imaging results that were available during my care of the patient were reviewed by me and considered in my medical decision making (see chart for details).      I have reviewed her medical records including past results, notes and treatments.   I have ordered labs and reviewed results. FFn is negative NST reviewed Treatments in MAU included 4 doses of Procardia which diminished UCs to only 2 per hour. .    Assessment: Single IUP at [redacted]w[redacted]d Preterm Uterine Contractions  Plan: Discharge home Preterm Labor precautions and fetal kick counts Follow up in Office for prenatal visits  Encouraged to return if she develops worsening of symptoms, increase in pain, fever, or other concerning symptoms.   Pt stable at time of discharge.  Hansel Feinstein CNM, MSN Certified Nurse-Midwife 08/25/2022 2:52 AM

## 2022-08-25 NOTE — MAU Note (Signed)
.  [name] is a 37 y.o. at [redacted]w[redacted]d here in MAU reporting:   Contractions every: 5 minutes Onset of ctx: Today 2pm Pain score: 3/10  ROM: Intact Vaginal Bleeding: None Last SVE: closed on Thursday   Fetal Movement: Reports positive FM FHT:130's via Patient taken straight to room    OB Office: [employer]  Lab orders placed from triage: Urinalysis

## 2022-09-09 ENCOUNTER — Other Ambulatory Visit: Payer: Self-pay | Admitting: Advanced Practice Midwife

## 2022-09-12 ENCOUNTER — Encounter (HOSPITAL_COMMUNITY): Payer: Self-pay

## 2022-09-12 NOTE — Patient Instructions (Signed)
[name]  09/12/2022   Your procedure is scheduled on:  09/25/2022  Arrive at 1000 at Entrance C on [address] at [facility]  and [employer]. You are invited to use the FREE valet parking or use the Visitor's parking deck.  Pick up the phone at the desk and dial [phone].  Call this number if you have problems the morning of surgery: [phone]  Remember:   Do not eat food:(After Midnight) Desps de medianoche.  Do not drink clear liquids: (After Midnight) Desps de medianoche.  Take these medicines the morning of surgery with A SIP OF WATER:  Zoloft labetalol prilosec and zyrtec   Do not wear jewelry, make-up or nail polish.  Do not wear lotions, powders, or perfumes. Do not wear deodorant.  Do not shave 48 hours prior to surgery.  Do not bring valuables to the hospital.  [facility] is not   responsible for any belongings or valuables brought to the hospital.  Contacts, dentures or bridgework may not be worn into surgery.  Leave suitcase in the car. After surgery it may be brought to your room.  For patients admitted to the hospital, checkout time is 11:00 AM the day of              discharge.      Please read over the following fact sheets that you were given:     Preparing for Surgery

## 2022-09-18 ENCOUNTER — Encounter (HOSPITAL_COMMUNITY): Payer: Self-pay | Admitting: Obstetrics and Gynecology

## 2022-09-18 ENCOUNTER — Inpatient Hospital Stay (HOSPITAL_COMMUNITY): Payer: 59

## 2022-09-18 ENCOUNTER — Encounter (HOSPITAL_COMMUNITY)
Admission: AD | Disposition: A | Discharge status: Home / Self Care | Payer: Self-pay | Source: Home / Self Care | Attending: Obstetrics and Gynecology

## 2022-09-18 ENCOUNTER — Inpatient Hospital Stay (HOSPITAL_COMMUNITY)
Admission: AD | Admit: 2022-09-18 | Discharge: 2022-09-21 | DRG: 785 | Disposition: A | Discharge status: Home / Self Care | Payer: 59 | Attending: Obstetrics and Gynecology | Admitting: Obstetrics and Gynecology

## 2022-09-18 DIAGNOSIS — O42913 Preterm premature rupture of membranes, unspecified as to length of time between rupture and onset of labor, third trimester: Principal | ICD-10-CM | POA: Diagnosis present

## 2022-09-18 DIAGNOSIS — O24425 Gestational diabetes mellitus in childbirth, controlled by oral hypoglycemic drugs: Secondary | ICD-10-CM | POA: Diagnosis present

## 2022-09-18 DIAGNOSIS — O99214 Obesity complicating childbirth: Secondary | ICD-10-CM | POA: Diagnosis present

## 2022-09-18 DIAGNOSIS — O134 Gestational [pregnancy-induced] hypertension without significant proteinuria, complicating childbirth: Secondary | ICD-10-CM | POA: Diagnosis present

## 2022-09-18 DIAGNOSIS — O9902 Anemia complicating childbirth: Secondary | ICD-10-CM | POA: Diagnosis present

## 2022-09-18 DIAGNOSIS — O34211 Maternal care for low transverse scar from previous cesarean delivery: Secondary | ICD-10-CM | POA: Diagnosis present

## 2022-09-18 DIAGNOSIS — Z302 Encounter for sterilization: Secondary | ICD-10-CM

## 2022-09-18 DIAGNOSIS — Z3A36 36 weeks gestation of pregnancy: Secondary | ICD-10-CM | POA: Diagnosis not present

## 2022-09-18 LAB — TYPE AND SCREEN
ABO/RH(D): O POS
Antibody Screen: NEGATIVE

## 2022-09-18 LAB — CBC
HCT: 30.3 % — ABNORMAL LOW (ref 36.0–46.0)
Hemoglobin: 9.2 g/dL — ABNORMAL LOW (ref 12.0–15.0)
MCH: 24.2 pg — ABNORMAL LOW (ref 26.0–34.0)
MCHC: 30.4 g/dL (ref 30.0–36.0)
MCV: 79.7 fL — ABNORMAL LOW (ref 80.0–100.0)
Platelets: 289 10*3/uL (ref 150–400)
RBC: 3.8 MIL/uL — ABNORMAL LOW (ref 3.87–5.11)
RDW: 14.6 % (ref 11.5–15.5)
WBC: 12.4 10*3/uL — ABNORMAL HIGH (ref 4.0–10.5)
nRBC: 0 % (ref 0.0–0.2)

## 2022-09-18 LAB — GLUCOSE, CAPILLARY
Glucose-Capillary: 90 mg/dL (ref 70–99)
Glucose-Capillary: 94 mg/dL (ref 70–99)

## 2022-09-18 LAB — CREATININE, SERUM
Creatinine, Ser: 0.53 mg/dL (ref 0.44–1.00)
GFR, Estimated: >60 mL/min (ref >60)

## 2022-09-18 LAB — RPR: RPR Ser Ql: NONREACTIVE

## 2022-09-18 LAB — POCT FERN TEST: POCT Fern Test: POSITIVE

## 2022-09-18 SURGERY — Surgical Case
Anesthesia: Spinal

## 2022-09-18 MED ORDER — IBUPROFEN 600 MG PO TABS
600.0000 mg | ORAL_TABLET | Freq: Four times a day (QID) | ORAL | Status: DC
  Administered 2022-09-19 – 2022-09-21 (×7): 600 mg via ORAL
  Filled 2022-09-18 (×7): qty 1

## 2022-09-18 MED ORDER — BUPIVACAINE IN DEXTROSE 0.75-8.25 % IT SOLN
INTRATHECAL | Status: DC | PRN
  Administered 2022-09-18: 1.6 mL via INTRATHECAL

## 2022-09-18 MED ORDER — FENTANYL CITRATE (PF) 100 MCG/2ML IJ SOLN
25.0000 ug | Freq: Two times a day (BID) | INTRAMUSCULAR | Status: DC | PRN
  Administered 2022-09-18 – 2022-09-19 (×2): 25 ug via INTRAVENOUS
  Filled 2022-09-18 (×2): qty 2

## 2022-09-18 MED ORDER — METOCLOPRAMIDE HCL 5 MG/ML IJ SOLN
INTRAMUSCULAR | Status: DC | PRN
  Administered 2022-09-18: 10 mg via INTRAVENOUS

## 2022-09-18 MED ORDER — KETOROLAC TROMETHAMINE 30 MG/ML IJ SOLN
30.0000 mg | Freq: Four times a day (QID) | INTRAMUSCULAR | Status: AC | PRN
  Administered 2022-09-18: 30 mg via INTRAVENOUS

## 2022-09-18 MED ORDER — SCOPOLAMINE 1 MG/3DAYS TD PT72
MEDICATED_PATCH | TRANSDERMAL | Status: AC
  Filled 2022-09-18: qty 1

## 2022-09-18 MED ORDER — PRENATAL MULTIVITAMIN CH
1.0000 | ORAL_TABLET | Freq: Every day | ORAL | Status: DC
  Administered 2022-09-19 – 2022-09-20 (×2): 1 via ORAL
  Filled 2022-09-18 (×2): qty 1

## 2022-09-18 MED ORDER — OXYTOCIN-SODIUM CHLORIDE 30-0.9 UT/500ML-% IV SOLN
INTRAVENOUS | Status: DC | PRN
  Administered 2022-09-18: 300 mL via INTRAVENOUS

## 2022-09-18 MED ORDER — MEPERIDINE HCL 25 MG/ML IJ SOLN: 6.2500 mg | INTRAMUSCULAR | Status: DC | PRN

## 2022-09-18 MED ORDER — DIBUCAINE (PERIANAL) 1 % EX OINT: 1.0000 | TOPICAL_OINTMENT | CUTANEOUS | Status: DC | PRN

## 2022-09-18 MED ORDER — DIPHENHYDRAMINE HCL 25 MG PO CAPS
25.0000 mg | ORAL_CAPSULE | Freq: Four times a day (QID) | ORAL | Status: DC | PRN
  Administered 2022-09-19: 25 mg via ORAL
  Filled 2022-09-18: qty 1

## 2022-09-18 MED ORDER — KETOROLAC TROMETHAMINE 30 MG/ML IJ SOLN
30.0000 mg | Freq: Four times a day (QID) | INTRAMUSCULAR | Status: AC | PRN
  Administered 2022-09-18: 30 mg via INTRAMUSCULAR

## 2022-09-18 MED ORDER — ACETAMINOPHEN 160 MG/5ML PO SOLN: 325.0000 mg | ORAL | Status: DC | PRN

## 2022-09-18 MED ORDER — CEFAZOLIN IN SODIUM CHLORIDE 3-0.9 GM/100ML-% IV SOLN
INTRAVENOUS | Status: AC
  Filled 2022-09-18: qty 100

## 2022-09-18 MED ORDER — SODIUM CHLORIDE 0.9 % IV SOLN
INTRAVENOUS | Status: DC | PRN
  Administered 2022-09-18: 500 mg via INTRAVENOUS

## 2022-09-18 MED ORDER — ZOLPIDEM TARTRATE 5 MG PO TABS: 5.0000 mg | ORAL_TABLET | Freq: Every evening | ORAL | Status: DC | PRN

## 2022-09-18 MED ORDER — NALOXONE HCL 4 MG/10ML IJ SOLN: 1.0000 ug/kg/h | INTRAVENOUS | Status: DC | PRN

## 2022-09-18 MED ORDER — LACTATED RINGERS IV SOLN: INTRAVENOUS | Status: DC

## 2022-09-18 MED ORDER — FENTANYL CITRATE (PF) 100 MCG/2ML IJ SOLN
INTRAMUSCULAR | Status: DC | PRN
  Administered 2022-09-18: 15 ug via INTRATHECAL

## 2022-09-18 MED ORDER — NALOXONE HCL 0.4 MG/ML IJ SOLN: 0.4000 mg | INTRAMUSCULAR | Status: DC | PRN

## 2022-09-18 MED ORDER — ONDANSETRON HCL 4 MG/2ML IJ SOLN: 4.0000 mg | Freq: Three times a day (TID) | INTRAMUSCULAR | Status: DC | PRN

## 2022-09-18 MED ORDER — HYDROCODONE-ACETAMINOPHEN 5-325 MG PO TABS
1.0000 | ORAL_TABLET | ORAL | Status: DC | PRN
  Administered 2022-09-19 – 2022-09-20 (×4): 2 via ORAL
  Administered 2022-09-20: 1 via ORAL
  Administered 2022-09-20 – 2022-09-21 (×2): 2 via ORAL
  Filled 2022-09-18 (×2): qty 2
  Filled 2022-09-18: qty 1
  Filled 2022-09-18 (×4): qty 2

## 2022-09-18 MED ORDER — MORPHINE SULFATE (PF) 0.5 MG/ML IJ SOLN
INTRAMUSCULAR | Status: AC
  Filled 2022-09-18: qty 10

## 2022-09-18 MED ORDER — SCOPOLAMINE 1 MG/3DAYS TD PT72: 1.0000 | MEDICATED_PATCH | Freq: Once | TRANSDERMAL | Status: DC

## 2022-09-18 MED ORDER — STERILE WATER FOR IRRIGATION IR SOLN
Status: DC | PRN
  Administered 2022-09-18: 1000 mL

## 2022-09-18 MED ORDER — MORPHINE SULFATE (PF) 0.5 MG/ML IJ SOLN
INTRAMUSCULAR | Status: DC | PRN
  Administered 2022-09-18: 150 ug via INTRATHECAL

## 2022-09-18 MED ORDER — METOCLOPRAMIDE HCL 5 MG/ML IJ SOLN
INTRAMUSCULAR | Status: AC
  Filled 2022-09-18: qty 2

## 2022-09-18 MED ORDER — KETOROLAC TROMETHAMINE 30 MG/ML IJ SOLN
30.0000 mg | Freq: Four times a day (QID) | INTRAMUSCULAR | Status: AC
  Administered 2022-09-18 – 2022-09-19 (×3): 30 mg via INTRAVENOUS
  Filled 2022-09-18 (×4): qty 1

## 2022-09-18 MED ORDER — SIMETHICONE 80 MG PO CHEW
80.0000 mg | CHEWABLE_TABLET | ORAL | Status: DC | PRN
  Administered 2022-09-20: 80 mg via ORAL
  Filled 2022-09-18: qty 1

## 2022-09-18 MED ORDER — OXYTOCIN-SODIUM CHLORIDE 30-0.9 UT/500ML-% IV SOLN
2.5000 [IU]/h | INTRAVENOUS | Status: AC
  Filled 2022-09-18: qty 500

## 2022-09-18 MED ORDER — DIPHENHYDRAMINE HCL 50 MG/ML IJ SOLN
12.5000 mg | INTRAMUSCULAR | Status: DC | PRN
  Administered 2022-09-18: 12.5 mg via INTRAVENOUS
  Filled 2022-09-18: qty 1

## 2022-09-18 MED ORDER — FENTANYL CITRATE (PF) 100 MCG/2ML IJ SOLN
INTRAMUSCULAR | Status: AC
  Filled 2022-09-18: qty 2

## 2022-09-18 MED ORDER — PROMETHAZINE HCL 25 MG/ML IJ SOLN: 6.2500 mg | INTRAMUSCULAR | Status: DC | PRN

## 2022-09-18 MED ORDER — MENTHOL 3 MG MT LOZG: 1.0000 | LOZENGE | OROMUCOSAL | Status: DC | PRN

## 2022-09-18 MED ORDER — WITCH HAZEL-GLYCERIN EX PADS: 1.0000 | MEDICATED_PAD | CUTANEOUS | Status: DC | PRN

## 2022-09-18 MED ORDER — SODIUM CHLORIDE 0.9 % IV SOLN
INTRAVENOUS | Status: AC
  Filled 2022-09-18: qty 5

## 2022-09-18 MED ORDER — ACETAMINOPHEN 10 MG/ML IV SOLN
1000.0000 mg | Freq: Once | INTRAVENOUS | Status: DC | PRN
  Administered 2022-09-18: 1000 mg via INTRAVENOUS

## 2022-09-18 MED ORDER — SIMETHICONE 80 MG PO CHEW
80.0000 mg | CHEWABLE_TABLET | Freq: Three times a day (TID) | ORAL | Status: DC
  Administered 2022-09-18 – 2022-09-21 (×8): 80 mg via ORAL
  Filled 2022-09-18 (×8): qty 1

## 2022-09-18 MED ORDER — ONDANSETRON HCL 4 MG/2ML IJ SOLN
INTRAMUSCULAR | Status: AC
  Filled 2022-09-18: qty 2

## 2022-09-18 MED ORDER — CEFAZOLIN IN SODIUM CHLORIDE 3-0.9 GM/100ML-% IV SOLN
3.0000 g | INTRAVENOUS | Status: AC
  Administered 2022-09-18: 3 g via INTRAVENOUS

## 2022-09-18 MED ORDER — FENTANYL CITRATE (PF) 100 MCG/2ML IJ SOLN: 25.0000 ug | INTRAMUSCULAR | Status: DC | PRN

## 2022-09-18 MED ORDER — SOD CITRATE-CITRIC ACID 500-334 MG/5ML PO SOLN: 30.0000 mL | Freq: Once | ORAL | Status: AC

## 2022-09-18 MED ORDER — COCONUT OIL OIL: 1.0000 | TOPICAL_OIL | Status: DC | PRN

## 2022-09-18 MED ORDER — ACETAMINOPHEN 325 MG PO TABS: 325.0000 mg | ORAL_TABLET | ORAL | Status: DC | PRN

## 2022-09-18 MED ORDER — SENNOSIDES-DOCUSATE SODIUM 8.6-50 MG PO TABS
2.0000 | ORAL_TABLET | Freq: Every day | ORAL | Status: DC
  Administered 2022-09-19 – 2022-09-21 (×3): 2 via ORAL
  Filled 2022-09-18 (×3): qty 2

## 2022-09-18 MED ORDER — POVIDONE-IODINE 10 % EX SWAB
2.0000 | Freq: Once | CUTANEOUS | Status: AC
  Administered 2022-09-18: 2 via TOPICAL

## 2022-09-18 MED ORDER — DIPHENHYDRAMINE HCL 25 MG PO CAPS: 25.0000 mg | ORAL_CAPSULE | ORAL | Status: DC | PRN

## 2022-09-18 MED ORDER — DEXAMETHASONE SODIUM PHOSPHATE 4 MG/ML IJ SOLN
INTRAMUSCULAR | Status: AC
  Filled 2022-09-18: qty 1

## 2022-09-18 MED ORDER — PHENYLEPHRINE HCL-NACL 20-0.9 MG/250ML-% IV SOLN
INTRAVENOUS | Status: DC | PRN
  Administered 2022-09-18: 60 ug/min via INTRAVENOUS

## 2022-09-18 MED ORDER — ENOXAPARIN SODIUM 80 MG/0.8ML IJ SOSY
70.0000 mg | PREFILLED_SYRINGE | INTRAMUSCULAR | Status: DC
  Administered 2022-09-19 – 2022-09-21 (×3): 70 mg via SUBCUTANEOUS
  Filled 2022-09-18 (×3): qty 0.8

## 2022-09-18 MED ORDER — SOD CITRATE-CITRIC ACID 500-334 MG/5ML PO SOLN
ORAL | Status: AC
  Administered 2022-09-18: 30 mL via ORAL
  Filled 2022-09-18: qty 30

## 2022-09-18 MED ORDER — SODIUM CHLORIDE 0.9% FLUSH: 3.0000 mL | INTRAVENOUS | Status: DC | PRN

## 2022-09-18 MED ORDER — DEXAMETHASONE SODIUM PHOSPHATE 10 MG/ML IJ SOLN
INTRAMUSCULAR | Status: DC | PRN
  Administered 2022-09-18: 4 mg via INTRAVENOUS

## 2022-09-18 MED ORDER — ONDANSETRON HCL 4 MG/2ML IJ SOLN
INTRAMUSCULAR | Status: DC | PRN
  Administered 2022-09-18: 4 mg via INTRAVENOUS

## 2022-09-18 MED ORDER — KETOROLAC TROMETHAMINE 30 MG/ML IJ SOLN
INTRAMUSCULAR | Status: AC
  Filled 2022-09-18: qty 1

## 2022-09-18 MED ORDER — ACETAMINOPHEN 10 MG/ML IV SOLN
INTRAVENOUS | Status: AC
  Filled 2022-09-18: qty 100

## 2022-09-18 MED ORDER — SCOPOLAMINE 1 MG/3DAYS TD PT72
MEDICATED_PATCH | TRANSDERMAL | Status: DC | PRN
  Administered 2022-09-18: 1 via TRANSDERMAL

## 2022-09-18 SURGICAL SUPPLY — 45 items
BENZOIN TINCTURE PRP APPL 2/3 (GAUZE/BANDAGES/DRESSINGS) ×1 IMPLANT
CANISTER PREVENA PLUS 150 (CANNISTER) IMPLANT
CHLORAPREP W/TINT 26 (MISCELLANEOUS) ×2 IMPLANT
CLAMP UMBILICAL CORD (MISCELLANEOUS) ×1 IMPLANT
CLOTH BEACON ORANGE TIMEOUT ST (SAFETY) ×1 IMPLANT
DERMABOND ADVANCED .7 DNX12 (GAUZE/BANDAGES/DRESSINGS) IMPLANT
DERMABOND ADVANCED .7 DNX6 (GAUZE/BANDAGES/DRESSINGS) IMPLANT
DRESSING PREVENA PLUS CUSTOM (GAUZE/BANDAGES/DRESSINGS) IMPLANT
DRSG OPSITE POSTOP 4X10 (GAUZE/BANDAGES/DRESSINGS) ×1 IMPLANT
DRSG PREVENA PLUS CUSTOM (GAUZE/BANDAGES/DRESSINGS) ×1
ELECT REM PT RETURN 9FT ADLT (ELECTROSURGICAL) ×1
ELECTRODE REM PT RTRN 9FT ADLT (ELECTROSURGICAL) ×1 IMPLANT
EXCISOR BIOPSY CONE FISHER (MISCELLANEOUS) IMPLANT
EXTRACTOR VACUUM KIWI (MISCELLANEOUS) IMPLANT
GAUZE SPONGE 4X4 12PLY STRL LF (GAUZE/BANDAGES/DRESSINGS) IMPLANT
GLOVE BIOGEL M STER SZ 6 (GLOVE) ×1 IMPLANT
GLOVE BIOGEL PI IND STRL 6.5 (GLOVE) ×1 IMPLANT
GLOVE BIOGEL PI IND STRL 7.0 (GLOVE) ×1 IMPLANT
GOWN STRL REUS W/TWL LRG LVL3 (GOWN DISPOSABLE) ×2 IMPLANT
KIT ABG SYR 3ML LUER SLIP (SYRINGE) IMPLANT
LIGASURE IMPACT 36 18CM CVD LR (INSTRUMENTS) IMPLANT
MAT PREVALON FULL STRYKER (MISCELLANEOUS) IMPLANT
NDL HYPO 25X5/8 SAFETYGLIDE (NEEDLE) IMPLANT
NEEDLE HYPO 25X5/8 SAFETYGLIDE (NEEDLE) IMPLANT
NS IRRIG 1000ML POUR BTL (IV SOLUTION) ×1 IMPLANT
PACK C SECTION WH (CUSTOM PROCEDURE TRAY) ×1 IMPLANT
PAD OB MATERNITY 4.3X12.25 (PERSONAL CARE ITEMS) ×1 IMPLANT
RETRACTOR TRAXI PANNICULUS (MISCELLANEOUS) IMPLANT
RTRCTR C-SECT PINK 25CM LRG (MISCELLANEOUS) ×1 IMPLANT
STRIP CLOSURE SKIN 1/2X4 (GAUZE/BANDAGES/DRESSINGS) ×1 IMPLANT
SUT MNCRL 0 VIOLET CTX 36 (SUTURE) ×2 IMPLANT
SUT MONOCRYL 0 CTX 36 (SUTURE) ×2
SUT PDS AB 0 CTX 60 (SUTURE) IMPLANT
SUT PLAIN 0 NONE (SUTURE) IMPLANT
SUT PLAIN 2 0 (SUTURE) ×1
SUT PLAIN ABS 2-0 CT1 27XMFL (SUTURE) ×1 IMPLANT
SUT VIC AB 0 CTX 36 (SUTURE) ×2
SUT VIC AB 0 CTX36XBRD ANBCTRL (SUTURE) ×2 IMPLANT
SUT VIC AB 2-0 CT1 27 (SUTURE) ×1
SUT VIC AB 2-0 CT1 TAPERPNT 27 (SUTURE) ×1 IMPLANT
SUT VIC AB 4-0 PS2 27 (SUTURE) ×1 IMPLANT
SUT VICRYL 4-0 PS2 18IN ABS (SUTURE) ×1 IMPLANT
TOWEL OR 17X24 6PK STRL BLUE (TOWEL DISPOSABLE) ×1 IMPLANT
TRAY FOLEY W/BAG SLVR 14FR LF (SET/KITS/TRAYS/PACK) ×1 IMPLANT
WATER STERILE IRR 1000ML POUR (IV SOLUTION) ×1 IMPLANT

## 2022-09-18 NOTE — H&P (Signed)
[name] is a 37 y.o. female presenting for leaking fluid  37 yo G3P2002 @ 36+2 presents for leaking fluid and was confirmed PPROM. The patient's pregnancy has been complicated by morbid obesity, AMA, gestational hypertension and gestational diabetes.   Pregnancy Problems 1) AMA: NIPT low risk 2) Morbid Obesity 3) Gestational hypertension: labetalol 100mg  bID 4) A2GDM: metformin 500mg  BID OB History     Gravida  3   Para  2   Term  2   Preterm      AB      Living  2      SAB      IAB      Ectopic      Multiple      Live Births  2          Past Medical History:  Diagnosis Date   Anemia    with pregnancy   Anxiety    situational   Asthma    fitness induced Asthma, no inhaler"   Complication of anesthesia    took a long to get feeling back in legs after anesthesia with both c- sections   Esophageal spasm    Gallstones    symptomatic   GERD (gastroesophageal reflux disease)    H/O hidradenitis suppurativa    Headache    migraine   Obesity    Pneumonia    PONV (postoperative nausea and vomiting)    UTI (urinary tract infection)    Vaginal Pap smear, abnormal    Past Surgical History:  Procedure Laterality Date   CESAREAN SECTION  2010   CESAREAN SECTION N/A 12/17/2013   Procedure: CESAREAN SECTION;  Surgeon: Bing Plume, MD;  Location: [facility];  Service: Obstetrics;  Laterality: N/A;  1 1/2hrs OR time   CHOLECYSTECTOMY N/A 04/09/2018   Procedure: LAPAROSCOPIC CHOLECYSTECTOMY;  Surgeon: Abigail Miyamoto, MD;  Location: [facility];  Service: General;  Laterality: N/A;   TONSILLECTOMY     Family History: family history includes Diabetes in her father; Hypertension in her father and mother; Irritable bowel syndrome in her mother; Stroke in her maternal aunt. Social History:  reports that she has never smoked. She has never used smokeless tobacco. She reports current alcohol use. She reports that she does not use drugs.     Maternal Diabetes:  Yes:  Diabetes Type:  Insulin/Medication controlled Genetic Screening: Normal Maternal Ultrasounds/Referrals: Normal Fetal Ultrasounds or other Referrals:  Referred to Materal Fetal Medicine  Maternal Substance Abuse:  No Significant Maternal Medications:  Meds include: Other:  Significant Maternal Lab Results:  None Number of Prenatal Visits:greater than 3 verified prenatal visits Other Comments:  None  Review of Systems History Dilation:  (RN attempted, SVE underdetermined) Blood pressure 135/87, pulse (!) 114, temperature 98 F (36.7 C), temperature source Oral, resp. rate 18, height 5\' 8"  (1.727 m), weight (!) 144.5 kg, last menstrual period 01/07/2022, SpO2 100 %. Exam Physical Exam  AOx3, NAD Gravid, soft FHR 140 reactive cat 1 tracing  Prenatal labs: ABO, Rh: --/--/PENDING (04/15 0631) Antibody: PENDING (04/15 0631) Rubella: Immune (10/04 0000) RPR: Nonreactive (10/04 0000)  HBsAg: Negative (10/04 0000)  HIV: Non-reactive (10/04 0000)  GBS:  unknown  Assessment/Plan: 1) admit 2) SCDs for DVT prophylaxis 3) Plan for repeat C/S with bilateral salpingectomy for permanent sterilization   Waynard Reeds 09/18/2022, 7:17 AM

## 2022-09-18 NOTE — Transfer of Care (Signed)
Immediate Anesthesia Transfer of Care Note  Patient: [name]  Procedure(s) Performed: CESAREAN SECTION  Patient Location: PACU  Anesthesia Type:Spinal  Level of Consciousness: awake, alert , and oriented  Airway & Oxygen Therapy: Patient Spontanous Breathing  Post-op Assessment: Report given to RN and Post -op Vital signs reviewed and stable  Post vital signs: Reviewed and stable  Last Vitals:  Vitals Value Taken Time  BP 131/68 09/18/22 0933  Temp    Pulse 84 09/18/22 0936  Resp 23 09/18/22 0936  SpO2 96 % 09/18/22 0936  Vitals shown include unvalidated device data.  Last Pain:  Vitals:   09/18/22 0601  TempSrc: Oral         Complications: No notable events documented.

## 2022-09-18 NOTE — Progress Notes (Signed)
Patient declines Lactation.

## 2022-09-18 NOTE — Anesthesia Postprocedure Evaluation (Signed)
Anesthesia Post Note  Patient: [name]  Procedure(s) Performed: CESAREAN SECTION     Patient location during evaluation: PACU Anesthesia Type: Spinal Level of consciousness: oriented and awake and alert Pain management: pain level controlled Vital Signs Assessment: post-procedure vital signs reviewed and stable Respiratory status: spontaneous breathing, respiratory function stable and patient connected to nasal cannula oxygen Cardiovascular status: blood pressure returned to baseline and stable Postop Assessment: no headache, no backache, no apparent nausea or vomiting and spinal receding Anesthetic complications: no  No notable events documented.  Last Vitals:  Vitals:   09/18/22 1014 09/18/22 1038  BP:  (!) 113/57  Pulse: 85 80  Resp: (!) 21   Temp:  36.9 C  SpO2: 98% 99%    Last Pain:  Vitals:   09/18/22 1038  TempSrc: Axillary  PainSc: 0-No pain   Pain Goal: Patients Stated Pain Goal: 5 (09/18/22 1000)              Epidural/Spinal Function Cutaneous sensation: Able to Wiggle Toes (09/18/22 1038), Patient able to flex knees: Yes (09/18/22 1038), Patient able to lift hips off bed: Yes (09/18/22 1038), Back pain beyond tenderness at insertion site: No (09/18/22 1038), Progressively worsening motor and/or sensory loss: No (09/18/22 1038), Bowel and/or bladder incontinence post epidural: No (09/18/22 1038)  Shelton Silvas

## 2022-09-18 NOTE — Op Note (Signed)
Operative Report   Pre-Operative Diagnosis: 1) 36+2-week intrauterine pregnancy 2) preterm premature rupture of membranes 3) history of prior cesarean section, desires repeat 4) desired permanent sterilization 5) maternal morbid obesity, BMI 48  Postoperative Diagnosis: 1) 36+2-week intrauterine pregnancy 2) preterm premature rupture of membranes 3) history of prior cesarean section, desires repeat 4) desired permanent sterilization 5) maternal morbid obesity, BMI 48  Procedure: Repeat low-transverse cesarean section with bilateral salpingectomy  Surgeon: Dr. Waynard Reeds  Assistant: Dr. Lavina Hamman  Antibiotics: Ancef 3 g, azithromycin 500 mg  Operative Findings: Vigorous female infant in the vertex presentation with Apgar scores of 8 at 1 minute and 9 at 5 minutes.  Normal-appearing ovaries and tubes.  No significant adhesive disease of the uterus to the anterior abdominal wall.  Specimen: Placenta for disposal, bilateral fallopian tubes to pathology  AOZ:HYQMV I/O In: 1650 [I.V.:1300; IV Piggyback:350] Out: 206 [Urine:100; Blood:106]   Procedure:[name] is an 38 year old gravida 3 para 2002 at 12 weeks and 2 days estimated gestational age who presents for cesarean section. Following the appropriate informed consent the patient was brought to the operating room where spinal anesthesia was administered and found to be adequate. She was placed in the dorsal supine position with a leftward tilt. She was prepped and draped in the normal sterile fashion.  The patient was appropriately identified during a preoperative timeout procedure.  The scalpel was then used to make a Pfannenstiel skin incision which was carried down to the underlying layers of soft tissue to the fascia. The fascia was incised in the midline and the fascial incision was extended laterally with Mayo scissors. The superior aspect of the fascial incision was grasped with Coker clamps x2, tented up and the rectus muscles  dissected off sharply with the electrocautery unit. The same procedure was repeated on the inferior aspect of the fascial incision. The rectus muscles were separated in the midline. The abdominal peritoneum was identified, tented up, entered sharply, and the incision was extended superiorly and inferiorly with good visualization of the bladder. The Alexis retractor was then deployed. The vesicouterine peritoneum was identified, tented up, entered sharply, and the bladder flap was created digitally.  The scalpel was then used to make a low transverse incision on the uterus which was extended laterally with blunt dissection. The fetal vertex was identified, delivered easily through the uterine incision followed by the body. The infant was bulb suctioned on the operative field and cried vigorously.  Following a 1 minute delay, the cord was clamped and cut. The infant was passed to the waiting neonatology team. Placenta was then delivered spontaneously and the uterus was cleared of all clot and debris. The uterine incision was repaired with #1 chromic in running locked fashion . The ovaries and tubes were inspected and normal.  The left fallopian tube was grasped with a Babcock clamp x 2 and tented up.  The LigaSure device was used to take successive incisions of the mesosalpinx up to the level of the fallopian tube insertion into the uterine fundus.  The fallopian tube was then transected at the level of the uterine fundus.  The excised mesosalpinx was hemostatic.  The same procedure was repeated on the right fallopian tube.The Alexis retractor was removed. The abdominal peritoneum was reapproximated with 2-0 Vicryl in a running fashion. The rectus muscles was reapproximated with 2-0 chromic in a running fashion. The fascia was closed with 0-looped PDS in a running fashion.  The subcutaneous tissue was reapproximated with 2-0 plain gut  interrupted sutures after the scarred subcutaneous tissue was undermined and taken  down off of the overlying skin. the skin was closed with 4-0 vicryl in a subcuticular fashion and Dermabond.  The Prevena wound vacuum was then placed.  All laparotomy sponge, instrument, and needle counts were correct.  The patient tolerated the procedure well and was transferred to the recovery unit in stable condition following the procedure.

## 2022-09-18 NOTE — Anesthesia Preprocedure Evaluation (Addendum)
Anesthesia Evaluation  Patient identified by MRN, date of birth, ID band Patient awake    Reviewed: Allergy & Precautions, NPO status , Patient's Chart, lab work & pertinent test results, reviewed documented beta blocker date and time   Airway Mallampati: III  TM Distance: >3 FB Neck ROM: Full    Dental  (+) Teeth Intact   Pulmonary asthma    breath sounds clear to auscultation       Cardiovascular hypertension, Pt. on medications and Pt. on home beta blockers  Rhythm:Regular Rate:Normal     Neuro/Psych  Headaches  Anxiety        GI/Hepatic Neg liver ROS,GERD  Medicated,,  Endo/Other  diabetes, Oral Hypoglycemic Agents    Renal/GU negative Renal ROS     Musculoskeletal   Abdominal  (+) + obese  Peds  Hematology   Anesthesia Other Findings   Reproductive/Obstetrics (+) Pregnancy                             Anesthesia Physical Anesthesia Plan  ASA: 3  Anesthesia Plan: Spinal   Post-op Pain Management: Minimal or no pain anticipated   Induction: Intravenous  PONV Risk Score and Plan: Ondansetron  Airway Management Planned: Natural Airway  Additional Equipment: None  Intra-op Plan:   Post-operative Plan:   Informed Consent: I have reviewed the patients History and Physical, chart, labs and discussed the procedure including the risks, benefits and alternatives for the proposed anesthesia with the patient or authorized representative who has indicated his/her understanding and acceptance.       Plan Discussed with: CRNA  Anesthesia Plan Comments: (Lab Results      Component                Value               Date                      WBC                      12.4 (H)            09/18/2022                HGB                      9.2 (L)             09/18/2022                HCT                      30.3 (L)            09/18/2022                MCV                      79.7  (L)            09/18/2022                PLT                      289                 09/18/2022           )  Anesthesia Quick Evaluation  

## 2022-09-18 NOTE — Lactation Note (Signed)
This note was copied from a baby's chart. Lactation Consultation Note  Patient Name: [name] [id number] Date: 09/18/2022 Age:37 hours Reason for consult:  (per [name] - mom exp and declined [facility])    Consult Status Consult Status: Complete Date: 09/18/22    Kathrin Greathouse 09/18/2022, 3:03 PM

## 2022-09-18 NOTE — Anesthesia Procedure Notes (Signed)
Spinal  Start time: 09/18/2022 7:55 AM End time: 09/18/2022 7:58 AM Reason for block: surgical anesthesia Staffing Performed: anesthesiologist  Anesthesiologist: Shelton Silvas, MD Performed by: Shelton Silvas, MD Authorized by: Shelton Silvas, MD   Preanesthetic Checklist Completed: patient identified, IV checked, site marked, risks and benefits discussed, surgical consent, monitors and equipment checked, pre-op evaluation and timeout performed Spinal Block Patient position: sitting Prep: DuraPrep and site prepped and draped Location: L3-4 Injection technique: single-shot Needle Needle type: Pencan  Needle gauge: 24 G Needle length: 10 cm Needle insertion depth: 10 cm Additional Notes Patient tolerated well. No immediate complications.  Functioning IV was confirmed and monitors were applied. Sterile prep and drape, including hand hygiene and sterile gloves were used. The patient was positioned and the back was prepped. The skin was anesthetized with lidocaine. Free flow of clear CSF was obtained prior to injecting local anesthetic into the CSF. The spinal needle aspirated freely following injection. The needle was carefully withdrawn. The patient tolerated the procedure well.

## 2022-09-18 NOTE — MAU Note (Signed)
.  [name] is a 37 y.o. at [redacted]w[redacted]d here in MAU reporting:   Contractions every: 4-5 minutes Onset of ctx: Today Pain score: 5/10  ROM: Possible ROM 0400 Vaginal Bleeding: None Last SVE: [facility] No recent intercourse Epidural: Unable to assess  Fetal Movement: Reports positive FM FHT:140 via External  Vitals:   09/18/22 0601 09/18/22 0603  BP: (!) 148/80 (!) 148/80  Pulse: (!) 103 (!) 103  Resp: 18   Temp: 98 F (36.7 C)   SpO2: 100%       C/S x2, GHTN on Labetalol  OB Office: [place] GBS: Unknown HSV: Denies hx of HSV Lab orders placed from triage: MAU Labor Eval

## 2022-09-19 LAB — CBC
HCT: 25.2 % — ABNORMAL LOW (ref 36.0–46.0)
Hemoglobin: 7.6 g/dL — ABNORMAL LOW (ref 12.0–15.0)
MCH: 23.9 pg — ABNORMAL LOW (ref 26.0–34.0)
MCHC: 30.2 g/dL (ref 30.0–36.0)
MCV: 79.2 fL — ABNORMAL LOW (ref 80.0–100.0)
Platelets: 223 10*3/uL (ref 150–400)
RBC: 3.18 MIL/uL — ABNORMAL LOW (ref 3.87–5.11)
RDW: 14.6 % (ref 11.5–15.5)
WBC: 14.8 10*3/uL — ABNORMAL HIGH (ref 4.0–10.5)
nRBC: 0 % (ref 0.0–0.2)

## 2022-09-19 LAB — BIRTH TISSUE RECOVERY COLLECTION (PLACENTA DONATION)

## 2022-09-19 MED ORDER — SODIUM CHLORIDE 0.9 % IV BOLUS: 500.0000 mL | Freq: Once | INTRAVENOUS | Status: DC | PRN

## 2022-09-19 MED ORDER — SODIUM CHLORIDE 0.9 % IV SOLN: INTRAVENOUS | Status: DC | PRN

## 2022-09-19 MED ORDER — DIPHENHYDRAMINE HCL 50 MG/ML IJ SOLN: 25.0000 mg | Freq: Once | INTRAMUSCULAR | Status: DC | PRN

## 2022-09-19 MED ORDER — SERTRALINE HCL 100 MG PO TABS
100.0000 mg | ORAL_TABLET | Freq: Every day | ORAL | Status: DC
  Administered 2022-09-19 – 2022-09-21 (×3): 100 mg via ORAL
  Filled 2022-09-19 (×3): qty 1

## 2022-09-19 MED ORDER — ALBUTEROL SULFATE (2.5 MG/3ML) 0.083% IN NEBU: 2.5000 mg | INHALATION_SOLUTION | Freq: Once | RESPIRATORY_TRACT | Status: DC | PRN

## 2022-09-19 MED ORDER — METHYLPREDNISOLONE SODIUM SUCC 125 MG IJ SOLR: 125.0000 mg | Freq: Once | INTRAMUSCULAR | Status: DC | PRN

## 2022-09-19 MED ORDER — IRON SUCROSE 500 MG IVPB - SIMPLE MED
500.0000 mg | Freq: Once | INTRAVENOUS | Status: AC
  Administered 2022-09-19: 500 mg via INTRAVENOUS
  Filled 2022-09-19: qty 25
  Filled 2022-09-19: qty 275

## 2022-09-19 MED ORDER — PANTOPRAZOLE SODIUM 40 MG PO TBEC
40.0000 mg | DELAYED_RELEASE_TABLET | Freq: Every day | ORAL | Status: DC
  Administered 2022-09-19 – 2022-09-21 (×3): 40 mg via ORAL
  Filled 2022-09-19 (×3): qty 1

## 2022-09-19 MED ORDER — EPINEPHRINE PF 1 MG/ML IJ SOLN: 0.3000 mg | Freq: Once | INTRAMUSCULAR | Status: DC | PRN

## 2022-09-19 MED ORDER — PHENYLEPHRINE HCL-NACL 20-0.9 MG/250ML-% IV SOLN
INTRAVENOUS | Status: AC
  Filled 2022-09-19: qty 500

## 2022-09-19 NOTE — Social Work (Signed)
MOB was referred for history of depression/anxiety.  * Referral screened out by Clinical Social Worker because none of the following criteria appear to apply:  ~ History of anxiety/depression during this pregnancy, or of post-partum depression following prior delivery.  ~ Diagnosis of anxiety and/or depression within last 3 years OR * MOB's symptoms currently being treated with medication and/or therapy. Per chart review MOB has an active prescription for Sertraline.   Please contact the Clinical Social Worker if needs arise, by [facility] request, or if MOB scores greater than 9/yes to question 10 on [place] Postpartum Depression Screen.  Wende Neighbors, LCSWA Clinical Social Worker [phone]

## 2022-09-19 NOTE — Progress Notes (Addendum)
Patient is doing well.  She is tolerating PO, ambulating, voiding.  Pain is controlled.  Lochia is appropriate  Vitals:   09/18/22 2013 09/19/22 0015 09/19/22 0412 09/19/22 0512  BP: 112/65 134/74 (!) 142/77 121/67  Pulse: 88 78 83 78  Resp: Temp: 98.7 F (37.1 C) 98.2 F (36.8 C) 98 F (36.7 C)   TempSrc: Axillary Axillary Oral   SpO2: 96% 99%    Weight:      Height:        NAD Abdomen:  soft, appropriate tenderness, Prevena wound vacuum in place ext:    Symmetric, 2+ edema bilaterally  Lab Results  Component Value Date   WBC 14.8 (H) 09/19/2022   HGB 7.6 (L) 09/19/2022   HCT 25.2 (L) 09/19/2022   MCV 79.2 (L) 09/19/2022   PLT 223 09/19/2022    --/--/O POS (04/15 0631)  A/P    37 y.o. [id number] POD #2 s/p RCS at 36 weeks for PPROM and bilateral salpingectomy Routine post op and postpartum care.   GHTN:  BPs mild range since delivery with the exception of a 142/77 overnight.  Labetalol  BID was stopped with delivery.  Will monitor closely ABLA, likely on top of chronic anemia of pregnancy: hgb 9.2-->7.6.  Asymptomatic--discussed IV iron--she accepts.  Will administer today GDMA2:  PP f/u  Desires neonatal circumcision.   Discussed r/b/a of the procedure.  Reviewed that circumcision is an elective surgical procedure and not considered medically necessary.  Reviewed the risks of the procedure including the risk of infection, bleeding, damage to surrounding structures, including scrotum, shaft, urethra and head of penis, and an undesired cosmetic effect requiring additional procedures for revision.    Addendum:  TC from RN.  She reports baby had a single 5 minute feed in last 8 hours.  Asks to defer circumcision at this time to allow feeding to improve.  Will plan for tomorrow    Marlow Baars, MD 09/19/2022, 8:07 AM

## 2022-09-20 ENCOUNTER — Other Ambulatory Visit: Payer: Self-pay

## 2022-09-20 LAB — SURGICAL PATHOLOGY

## 2022-09-20 MED ORDER — LABETALOL HCL 100 MG PO TABS
100.0000 mg | ORAL_TABLET | Freq: Two times a day (BID) | ORAL | Status: DC
  Administered 2022-09-20 – 2022-09-21 (×3): 100 mg via ORAL
  Filled 2022-09-20 (×3): qty 1

## 2022-09-20 MED ORDER — SERTRALINE HCL 100 MG PO TABS
100.0000 mg | ORAL_TABLET | Freq: Every day | ORAL | Status: DC
  Filled 2022-09-20: qty 1

## 2022-09-20 NOTE — Plan of Care (Signed)
  Problem: Clinical Measurements: Goal: Will remain free from infection Outcome: Progressing Goal: Diagnostic test results will improve Outcome: Progressing Goal: Respiratory complications will improve Outcome: Progressing Goal: Cardiovascular complication will be avoided Outcome: Progressing   Problem: Activity: Goal: Risk for activity intolerance will decrease Outcome: Progressing   Problem: Nutrition: Goal: Adequate nutrition will be maintained Outcome: Progressing   Problem: Coping: Goal: Level of anxiety will decrease Outcome: Progressing   Problem: Elimination: Goal: Will not experience complications related to bowel motility Outcome: Progressing Goal: Will not experience complications related to urinary retention Outcome: Progressing   Problem: Safety: Goal: Ability to remain free from injury will improve Outcome: Progressing   Problem: Skin Integrity: Goal: Risk for impaired skin integrity will decrease Outcome: Progressing   Problem: Education: Goal: Knowledge of the prescribed therapeutic regimen will improve Outcome: Progressing Goal: Understanding of sexual limitations or changes related to disease process or condition will improve Outcome: Progressing Goal: Individualized Educational Video(s) Outcome: Progressing   Problem: Self-Concept: Goal: Communication of feelings regarding changes in body function or appearance will improve Outcome: Progressing   Problem: Skin Integrity: Goal: Demonstration of wound healing without infection will improve Outcome: Progressing   Problem: Activity: Goal: Will verbalize the importance of balancing activity with adequate rest periods Outcome: Progressing Goal: Ability to tolerate increased activity will improve Outcome: Progressing   Problem: Coping: Goal: Ability to identify and utilize available resources and services will improve Outcome: Progressing   Problem: Life Cycle: Goal: Chance of risk for  complications during the postpartum period will decrease Outcome: Progressing   Problem: Skin Integrity: Goal: Demonstration of wound healing without infection will improve Outcome: Progressing

## 2022-09-20 NOTE — Progress Notes (Signed)
Patient is eating, ambulating minimally, voiding.  Pain control is good.  Appropriate lochia.  Denies CP/SOB. +flatus.  Pt does not feel ready to go home.  Vitals:   09/19/22 1210 09/19/22 1635 09/19/22 1810 09/20/22 0643  BP: 137/64 (!) 141/62 137/71 133/78  Pulse: 85 87 79   Resp: Temp: 98.4 F (36.9 C) 98.1 F (36.7 C) 98 F (36.7 C) 98.3 F (36.8 C)  TempSrc: Axillary Oral Oral Oral  SpO2: 100% 99% 100%   Weight:      Height:        Fundus firm Inc: wound vac intact, no drainage Ext: no calf tenderness  Lab Results  Component Value Date   WBC 14.8 (H) 09/19/2022   HGB 7.6 (L) 09/19/2022   HCT 25.2 (L) 09/19/2022   MCV 79.2 (L) 09/19/2022   PLT 223 09/19/2022    --/--/O POS (04/15 0631)  A/P Post op day #2 s/p repeat C/S with BTL. MOB- on lovenox and with wound vac Anemia- anemia of pregnancy with acute blood loss, s/p IV iron x 1 yesterday GHTN- labetalol not restarted after delivery, BPs have generally been<140/90, will continue to monitor closely GDMA2- post of BG 90 Encouraged ambulation Pt does not want to go home today. Expect d/c 4/18.   Circ completed.  Philip Aspen

## 2022-09-21 MED ORDER — HYDROCODONE-ACETAMINOPHEN 5-325 MG PO TABS: 1.0000 | ORAL_TABLET | Freq: Four times a day (QID) | ORAL | 0 refills | Status: AC | PRN

## 2022-09-21 MED ORDER — IBUPROFEN 600 MG PO TABS: 600.0000 mg | ORAL_TABLET | Freq: Four times a day (QID) | ORAL | 1 refills | Status: AC | PRN

## 2022-09-21 NOTE — Discharge Summary (Signed)
Postpartum Discharge Summary  Date of Service updated      Patient Name: [name] DOB: 16-May-1986 MRN: [id number]  Date of admission: 09/18/2022 Delivery date:09/18/2022  Delivering provider: Waynard Reeds  Date of discharge: 09/21/2022  Admitting diagnosis: Cesarean delivery delivered [O82] Encounter for maternal care for low transverse scar from repeat cesarean delivery [O34.211] Intrauterine pregnancy: [redacted]w[redacted]d     Secondary diagnosis:  Principal Problem:   Cesarean delivery delivered Active Problems:   Encounter for maternal care for low transverse scar from repeat cesarean delivery  Additional problems: GHTN and A2GDM    Discharge diagnosis: Preterm Pregnancy Delivered, Gestational Hypertension, and GDM A2                                              Post partum procedures:blood transfusion Augmentation: N/A Complications: None  Hospital course: Sceduled C/S   37 y.o. yo G3P2103 at [redacted]w[redacted]d was admitted to the hospital 09/18/2022 for scheduled cesarean section with the following indication: PPROM .Delivery details are as follows:  Membrane Rupture Time/Date: 4:00 AM ,09/18/2022   Delivery Method:C-Section, Low Transverse  Details of operation can be found in separate operative note.  Patient had a postpartum course complicated by anemia of pregnancy.  She is ambulating, tolerating a regular diet, passing flatus, and urinating well. Patient is discharged home in stable condition on  09/21/22        Newborn Data: Birth date:09/18/2022  Birth time:8:32 AM  Gender:Female  Living status:Living  Apgars:8 ,9  Weight:3420 g     Magnesium Sulfate received: No BMZ received: No Transfusion:Yes  Physical exam  Vitals:   09/19/22 1810 09/20/22 0643 09/20/22 2240 09/21/22 0517  BP: 137/71 133/78 133/68 134/76  Pulse: 79  90 86  Resp: Temp: 98 F (36.7 C) 98.3 F (36.8 C) 98.3 F (36.8 C) 98.3 F (36.8 C)  TempSrc: Oral Oral Oral Oral  SpO2: 100%     Weight:       Height:       General: alert, cooperative, and no distress Lochia: appropriate Uterine Fundus: firm Incision: Dressing is clean, dry, and intact DVT Evaluation: No evidence of DVT seen on physical exam. Labs: Lab Results  Component Value Date   WBC 14.8 (H) 09/19/2022   HGB 7.6 (L) 09/19/2022   HCT 25.2 (L) 09/19/2022   MCV 79.2 (L) 09/19/2022   PLT 223 09/19/2022      Latest Ref Rng & Units 09/18/2022    6:38 AM  CMP  Creatinine 0.44 - 1.00 mg/dL 4.09    Edinburgh Score:    09/18/2022   10:38 AM  Edinburgh Postnatal Depression Scale Screening Tool  I have been able to laugh and see the funny side of things. 0  I have looked forward with enjoyment to things. 0  I have blamed myself unnecessarily when things went wrong. 0  I have been anxious or worried for no good reason. 0  I have felt scared or panicky for no good reason. 0  Things have been getting on top of me. 0  I have been so unhappy that I have had difficulty sleeping. 0  I have felt sad or miserable. 0  I have been so unhappy that I have been crying. 0  The thought of harming myself has occurred to me. 0  Inocente Salles Postnatal  Depression Scale Total 0      After visit meds:  Allergies as of 09/21/2022       Reactions   Percocet [oxycodone-acetaminophen] Nausea And Vomiting        Medication List     STOP taking these medications    aspirin EC 81 MG tablet   labetalol 100 MG tablet Commonly known as: NORMODYNE   Levsin 0.125 MG tablet Generic drug: hyoscyamine   metFORMIN 500 MG tablet Commonly known as: GLUCOPHAGE   NIFEdipine 10 MG capsule Commonly known as: Procardia       TAKE these medications    calcium carbonate 500 MG chewable tablet Commonly known as: TUMS - dosed in mg elemental calcium Chew 1-2 tablets by mouth 2 (two) times daily as needed for indigestion or heartburn.   cetirizine 5 MG tablet Commonly known as: ZYRTEC Take 5 mg by mouth daily.    HYDROcodone-acetaminophen 5-325 MG tablet Commonly known as: NORCO/VICODIN Take 1 tablet by mouth every 6 (six) hours as needed for up to 7 days for moderate pain.   ibuprofen 600 MG tablet Commonly known as: ADVIL Take 1 tablet (600 mg total) by mouth every 6 (six) hours as needed for moderate pain or cramping.   omeprazole 20 MG capsule Commonly known as: PRILOSEC   prenatal multivitamin Tabs tablet Take 1 tablet by mouth daily at 12 noon.   Zoloft 100 MG tablet Generic drug: sertraline Take 100 mg by mouth daily.               Discharge Care Instructions  (From admission, onward)           Start     Ordered   09/21/22 0000  Discharge wound care:       Comments: To office in 1 week for removal of incision. May shower, pat dry   09/21/22 1132             Discharge home in stable condition Infant Feeding: Bottle Infant Disposition:home with mother Discharge instruction: per After Visit Summary and Postpartum booklet. Activity: Advance as tolerated. Pelvic rest for 6 weeks.  Diet: carb modified diet, low salt diet, and iron rich diet Anticipated Birth Control:  BTL Postpartum Appointment:6 weeks Additional Postpartum F/U: 2 hour GTT, Incision check 1 week, and BP check 1 week Future Appointments:No future appointments. Follow up Visit:  Follow-up Information     Ob/Gyn, Nestor Ramp. Schedule an appointment as soon as possible for a visit.   Why: 1 week for BP and incision check ( has prevena) and 6 week postpartum visit Contact information: [address] [place] [address] [phone]                     09/21/2022 Cathrine Muster, DO

## 2022-09-21 NOTE — Plan of Care (Signed)
Patient to be discharged home with printed instructions. Esias Mory L Taniqua Issa, RN  

## 2022-09-21 NOTE — Progress Notes (Addendum)
Subjective: Postpartum Day 3: Cesarean Delivery Patient reports incisional pain, tolerating PO, + flatus, and no problems voiding. Tolerating PO, Denies HA, fever, lightheadedness, CP or SOB. Feels ready for discharge home today. Pain controlled with meds   Objective: Vital signs in last 24 hours: Temp:  [98.3 F (36.8 C)] 98.3 F (36.8 C) (04/18 0517) Pulse Rate:  [86-90] 86 (04/18 0517) Resp:  [18-20] 18 (04/18 0517) BP: (133-134)/(68-76) 134/76 (04/18 0517)  Physical Exam:  General: alert, cooperative, and no distress Lochia: appropriate Uterine Fundus: firm Incision: no significant drainage DVT Evaluation: No evidence of DVT seen on physical exam. Calf/Ankle edema is present.  Recent Labs    09/19/22 0546  HGB 7.6*  HCT 25.2*    Assessment/Plan: Status post Cesarean section. Doing well postoperatively.  Discharge home with standard precautions and return to clinic in 1 week for incision  and BP check and 6 weeks for pp visit .  Cathrine Muster, DO 09/21/2022, 10:04 AM

## 2022-09-21 NOTE — Discharge Instructions (Signed)
Call office with any concerns [phone] 

## 2022-09-22 ENCOUNTER — Encounter (HOSPITAL_COMMUNITY)
Admission: RE | Admit: 2022-09-22 | Discharge: 2022-09-22 | Disposition: A | Discharge status: Home / Self Care | Payer: 59 | Source: Ambulatory Visit | Attending: Obstetrics | Admitting: Obstetrics

## 2022-09-25 ENCOUNTER — Encounter (HOSPITAL_COMMUNITY): Admission: RE | Payer: Self-pay | Source: Home / Self Care

## 2022-09-25 ENCOUNTER — Inpatient Hospital Stay (HOSPITAL_COMMUNITY): Admission: RE | Admit: 2022-09-25 | Payer: 59 | Source: Home / Self Care | Admitting: Obstetrics

## 2022-09-25 SURGERY — Surgical Case
Anesthesia: Regional

## 2022-09-30 ENCOUNTER — Telehealth (HOSPITAL_COMMUNITY): Payer: Self-pay

## 2022-09-30 NOTE — Telephone Encounter (Signed)
Patient reports feeling good. She states that she went to an appointment with her OB and they removed the wound vac. Patient declines questions/concerns about her health and healing.  Patient reports that baby is doing well. Baby sleeps in a bassinet. RN reviewed ABC's of safe sleep with patient. Patient declines any questions or concerns about baby.  EPDS score is 0.  Suann Larry [facility]  Perinatal Services   09/30/22,1742
